# Patient Record
Sex: Female | Born: 1950 | Race: White | Hispanic: No | Marital: Married | Smoking: Never smoker
Health system: Southern US, Community
[De-identification: ages and names within clinical notes are randomized; demographics above are authoritative.]

## PROBLEM LIST (undated history)

## (undated) DIAGNOSIS — M8580 Other specified disorders of bone density and structure, unspecified site: Secondary | ICD-10-CM

## (undated) DIAGNOSIS — Z780 Asymptomatic menopausal state: Secondary | ICD-10-CM

## (undated) DIAGNOSIS — N946 Dysmenorrhea, unspecified: Secondary | ICD-10-CM

## (undated) DIAGNOSIS — G709 Myoneural disorder, unspecified: Secondary | ICD-10-CM

## (undated) DIAGNOSIS — E559 Vitamin D deficiency, unspecified: Secondary | ICD-10-CM

## (undated) DIAGNOSIS — M1990 Unspecified osteoarthritis, unspecified site: Secondary | ICD-10-CM

## (undated) DIAGNOSIS — I77810 Thoracic aortic ectasia: Secondary | ICD-10-CM

## (undated) DIAGNOSIS — I10 Essential (primary) hypertension: Secondary | ICD-10-CM

## (undated) HISTORY — DX: Unspecified osteoarthritis, unspecified site: M19.90

## (undated) HISTORY — DX: Vitamin D deficiency, unspecified: E55.9

## (undated) HISTORY — DX: Dysmenorrhea, unspecified: N94.6

## (undated) HISTORY — DX: Asymptomatic menopausal state: Z78.0

## (undated) HISTORY — PX: COLONOSCOPY: SHX174

## (undated) HISTORY — DX: Other specified disorders of bone density and structure, unspecified site: M85.80

---

## 1982-05-29 HISTORY — PX: AUGMENTATION MAMMAPLASTY: SUR837

## 1991-10-30 HISTORY — PX: CRYOTHERAPY: SHX1416

## 1997-10-29 DIAGNOSIS — Z780 Asymptomatic menopausal state: Secondary | ICD-10-CM

## 1997-10-29 HISTORY — DX: Asymptomatic menopausal state: Z78.0

## 1999-08-24 ENCOUNTER — Other Ambulatory Visit: Admission: RE | Admit: 1999-08-24 | Discharge: 1999-08-24 | Payer: Self-pay | Admitting: Obstetrics and Gynecology

## 1999-08-24 ENCOUNTER — Encounter (INDEPENDENT_AMBULATORY_CARE_PROVIDER_SITE_OTHER): Payer: Self-pay | Admitting: Specialist

## 1999-10-30 DIAGNOSIS — M8580 Other specified disorders of bone density and structure, unspecified site: Secondary | ICD-10-CM

## 1999-10-30 HISTORY — DX: Other specified disorders of bone density and structure, unspecified site: M85.80

## 2000-08-22 ENCOUNTER — Other Ambulatory Visit: Admission: RE | Admit: 2000-08-22 | Discharge: 2000-08-22 | Payer: Self-pay | Admitting: Obstetrics and Gynecology

## 2001-08-26 ENCOUNTER — Other Ambulatory Visit: Admission: RE | Admit: 2001-08-26 | Discharge: 2001-08-26 | Payer: Self-pay | Admitting: Obstetrics and Gynecology

## 2002-09-21 ENCOUNTER — Other Ambulatory Visit: Admission: RE | Admit: 2002-09-21 | Discharge: 2002-09-21 | Payer: Self-pay | Admitting: Obstetrics and Gynecology

## 2004-01-12 ENCOUNTER — Other Ambulatory Visit: Admission: RE | Admit: 2004-01-12 | Discharge: 2004-01-12 | Payer: Self-pay | Admitting: Obstetrics and Gynecology

## 2005-09-18 ENCOUNTER — Other Ambulatory Visit: Admission: RE | Admit: 2005-09-18 | Discharge: 2005-09-18 | Payer: Self-pay | Admitting: Obstetrics and Gynecology

## 2007-10-30 DIAGNOSIS — E559 Vitamin D deficiency, unspecified: Secondary | ICD-10-CM

## 2007-10-30 HISTORY — DX: Vitamin D deficiency, unspecified: E55.9

## 2008-01-23 ENCOUNTER — Other Ambulatory Visit: Admission: RE | Admit: 2008-01-23 | Discharge: 2008-01-23 | Payer: Self-pay | Admitting: Obstetrics and Gynecology

## 2009-01-24 ENCOUNTER — Other Ambulatory Visit: Admission: RE | Admit: 2009-01-24 | Discharge: 2009-01-24 | Payer: Self-pay | Admitting: Obstetrics and Gynecology

## 2013-03-03 ENCOUNTER — Encounter: Payer: Self-pay | Admitting: Certified Nurse Midwife

## 2013-03-24 ENCOUNTER — Ambulatory Visit: Payer: Self-pay | Admitting: Nurse Practitioner

## 2013-05-13 ENCOUNTER — Ambulatory Visit (INDEPENDENT_AMBULATORY_CARE_PROVIDER_SITE_OTHER): Payer: BC Managed Care – PPO | Admitting: Nurse Practitioner

## 2013-05-13 ENCOUNTER — Encounter: Payer: Self-pay | Admitting: Nurse Practitioner

## 2013-05-13 VITALS — BP 128/80 | HR 76 | Resp 16 | Ht 67.0 in | Wt 243.0 lb

## 2013-05-13 DIAGNOSIS — R3589 Other polyuria: Secondary | ICD-10-CM

## 2013-05-13 DIAGNOSIS — R358 Other polyuria: Secondary | ICD-10-CM

## 2013-05-13 DIAGNOSIS — Z01419 Encounter for gynecological examination (general) (routine) without abnormal findings: Secondary | ICD-10-CM

## 2013-05-13 DIAGNOSIS — Z0000 Encounter for general adult medical examination without abnormal findings: Secondary | ICD-10-CM

## 2013-05-13 DIAGNOSIS — Z23 Encounter for immunization: Secondary | ICD-10-CM

## 2013-05-13 LAB — COMPREHENSIVE METABOLIC PANEL
Albumin: 4.1 g/dL (ref 3.5–5.2)
Alkaline Phosphatase: 57 U/L (ref 39–117)
BUN: 17 mg/dL (ref 6–23)
CO2: 29 mEq/L (ref 19–32)
Calcium: 9.1 mg/dL (ref 8.4–10.5)
Glucose, Bld: 96 mg/dL (ref 70–99)
Potassium: 4.4 mEq/L (ref 3.5–5.3)
Total Protein: 6.9 g/dL (ref 6.0–8.3)

## 2013-05-13 LAB — POCT URINALYSIS DIPSTICK

## 2013-05-13 LAB — LIPID PANEL
Cholesterol: 199 mg/dL (ref 0–200)
HDL: 56 mg/dL (ref >39)
LDL Cholesterol: 125 mg/dL — ABNORMAL HIGH (ref 0–99)
Triglycerides: 91 mg/dL (ref <150)

## 2013-05-13 MED ORDER — ERGOCALCIFEROL 1.25 MG (50000 UT) PO CAPS: 50000.0000 [IU] | ORAL_CAPSULE | ORAL | Status: DC

## 2013-05-13 NOTE — Progress Notes (Signed)
62 y.o. G0 Married Caucasian Fe here for annual exam.  No new health problems. Continues to work full time. Husband with prostate cancer.  Then had liver biopsy and diagnosed with cancer and will be doing surgery or chemo in near future.  He has COPD. LMP: 10/29/1997           Sexually active: no  The current method of family planning is post menopausal status.    Exercising: no  not regularly Smoker:  no  Health Maintenance: Pap:  03/11/12- ASCUS - HR HPV MMG:  08/05/12 normal Colonoscopy:  No she is planning to get scheduled this year BMD:   2011 TDaP:  10/30/91 Labs: Hgb: 12.8    ;   Urine: Leuk's 2     No past medical history on file.  Past Surgical History  Procedure Laterality Date  . Augmentation mammaplasty    . Cryotherapy      Current Outpatient Prescriptions  Medication Sig Dispense Refill  . Acetaminophen (TYLENOL PO) Take by mouth as needed.      . Ascorbic Acid (VITAMIN C PO) Take by mouth daily.      . Calcium Carbonate-Vitamin D (CALCIUM + D PO) Take by mouth.      . Cholecalciferol (VITAMIN D PO) Take by mouth.      . fish oil-omega-3 fatty acids 1000 MG capsule Take 2 g by mouth daily.      . Horse Chestnut (VENASTAT PO) Take by mouth.      Marland Kitchen NAPROXEN PO Take by mouth.       No current facility-administered medications for this visit.    Family History  Problem Relation Age of Onset  . Hypertension Mother   . Osteoporosis Mother   . Hypertension Father   . Osteoporosis Maternal Aunt     ROS:  Pertinent items are noted in HPI.  Otherwise, a comprehensive ROS was negative.  Exam:   There were no vitals taken for this visit.    Ht Readings from Last 3 Encounters:  No data found for Ht    General appearance: alert, cooperative and appears stated age Head: Normocephalic, without obvious abnormality, atraumatic Neck: no adenopathy, supple, symmetrical, trachea midline and thyroid normal to inspection and palpation Lungs: clear to auscultation  bilaterally Breasts: normal appearance, no masses or tenderness Heart: regular rate and rhythm Abdomen: soft, non-tender; no masses,  no organomegaly Extremities: extremities normal, atraumatic, no cyanosis or edema Skin: Skin color, texture, turgor normal. No rashes or lesions Lymph nodes: Cervical, supraclavicular, and axillary nodes normal. No abnormal inguinal nodes palpated Neurologic: Grossly normal   Pelvic: External genitalia:  no lesions              Urethra:  normal appearing urethra with no masses, tenderness or lesions              Bartholin's and Skene's: normal                 Vagina: normal appearing vagina with normal color and discharge, no lesions              Cervix: anteverted              Pap taken: no Bimanual Exam:  Uterus:  normal size, contour, position, consistency, mobility, non-tender              Adnexa: no mass, fullness, tenderness               Rectovaginal: Confirms  Anus:  normal sphincter tone, no lesions  A:  Well Woman with normal exam  Postmenopausal   Not sexually active  Vit D deficiency  R/O UTI  Update TDaP today   P:   Pap smear as per guidelines   Mammogram due 10/14  Routine labs and will follow  TDaP given today  counseled on breast self exam, adequate intake of calcium and vitamin D,   diet and exercise return annually or prn  An After Visit Summary was printed and given to the patient.

## 2013-05-13 NOTE — Patient Instructions (Addendum)

## 2013-05-14 ENCOUNTER — Telehealth: Payer: Self-pay | Admitting: *Deleted

## 2013-05-14 LAB — URINE CULTURE
Colony Count: NO GROWTH
Organism ID, Bacteria: NO GROWTH

## 2013-05-14 NOTE — Telephone Encounter (Signed)
LVM for pt to return my call in regards to lab results.  

## 2013-05-14 NOTE — Telephone Encounter (Signed)
Message copied by Osie Bond on Thu May 14, 2013  1:41 PM ------      Message from: Ria Comment R      Created: Thu May 14, 2013  8:52 AM       Let patient know lab results. Continue on low cholesterol diet. Follow Vit D per protocol. ------

## 2013-05-14 NOTE — Progress Notes (Signed)
Encounter reviewed by Dr. Brook Silva.  

## 2013-05-15 NOTE — Telephone Encounter (Signed)
Patient returning Tiffany's call.

## 2013-05-15 NOTE — Telephone Encounter (Signed)
Pt notified of blood and urine results Pt would like to know she needs to schedule bone density this year.  Last DEXA was in 2011.  Family history of osteoporosis.  Please advise.  Pt aware she may not get answer until Monday.

## 2013-05-18 ENCOUNTER — Other Ambulatory Visit: Payer: Self-pay | Admitting: Nurse Practitioner

## 2013-05-18 DIAGNOSIS — M8580 Other specified disorders of bone density and structure, unspecified site: Secondary | ICD-10-CM

## 2013-05-18 NOTE — Telephone Encounter (Signed)
Patient does have osteopenia and should get repeat BMD.  Last one done 02/2010. Order has been sent

## 2013-05-19 ENCOUNTER — Telehealth: Payer: Self-pay | Admitting: *Deleted

## 2013-05-19 NOTE — Telephone Encounter (Signed)
2nd VM left for pt to return my call in regards to lab results.

## 2013-10-21 ENCOUNTER — Encounter: Payer: Self-pay | Admitting: Nurse Practitioner

## 2014-05-20 ENCOUNTER — Ambulatory Visit: Payer: BC Managed Care – PPO | Admitting: Nurse Practitioner

## 2014-05-26 ENCOUNTER — Telehealth: Payer: Self-pay | Admitting: *Deleted

## 2014-05-26 MED ORDER — ERGOCALCIFEROL 1.25 MG (50000 UT) PO CAPS: 50000.0000 [IU] | ORAL_CAPSULE | ORAL | Status: DC

## 2014-05-26 NOTE — Telephone Encounter (Signed)
Last refilled: 05/13/13 #30/3 refills Last AEX: 05/13/13 with Ms. Patty AEX Scheduled: 06/01/14 with Ms. Patty Last Vitamin D Level checked at AEX 50  Vitamin D 50,000 #30/0 refills sent to pharmacy to last patient until AEX   Routed to provider for review, encounter closed.

## 2014-06-01 ENCOUNTER — Encounter: Payer: Self-pay | Admitting: Nurse Practitioner

## 2014-06-01 ENCOUNTER — Ambulatory Visit (INDEPENDENT_AMBULATORY_CARE_PROVIDER_SITE_OTHER): Payer: BC Managed Care – PPO | Admitting: Nurse Practitioner

## 2014-06-01 VITALS — BP 126/80 | HR 68 | Ht 67.5 in | Wt 253.0 lb

## 2014-06-01 DIAGNOSIS — M949 Disorder of cartilage, unspecified: Secondary | ICD-10-CM

## 2014-06-01 DIAGNOSIS — Z1211 Encounter for screening for malignant neoplasm of colon: Secondary | ICD-10-CM

## 2014-06-01 DIAGNOSIS — R8290 Unspecified abnormal findings in urine: Secondary | ICD-10-CM

## 2014-06-01 DIAGNOSIS — Z0000 Encounter for general adult medical examination without abnormal findings: Secondary | ICD-10-CM

## 2014-06-01 DIAGNOSIS — M8580 Other specified disorders of bone density and structure, unspecified site: Secondary | ICD-10-CM

## 2014-06-01 DIAGNOSIS — R82998 Other abnormal findings in urine: Secondary | ICD-10-CM

## 2014-06-01 DIAGNOSIS — M899 Disorder of bone, unspecified: Secondary | ICD-10-CM

## 2014-06-01 DIAGNOSIS — Z01419 Encounter for gynecological examination (general) (routine) without abnormal findings: Secondary | ICD-10-CM

## 2014-06-01 LAB — LIPID PANEL
CHOL/HDL RATIO: 3.2 ratio
Cholesterol: 197 mg/dL (ref 0–200)
HDL: 62 mg/dL (ref >39)
LDL Cholesterol: 116 mg/dL — ABNORMAL HIGH (ref 0–99)
Triglycerides: 94 mg/dL (ref <150)
VLDL: 19 mg/dL (ref 0–40)

## 2014-06-01 LAB — COMPREHENSIVE METABOLIC PANEL
ALK PHOS: 57 U/L (ref 39–117)
ALT: 21 U/L (ref 0–35)
AST: 21 U/L (ref 0–37)
Albumin: 3.9 g/dL (ref 3.5–5.2)
BUN: 14 mg/dL (ref 6–23)
CALCIUM: 9.1 mg/dL (ref 8.4–10.5)
CO2: 28 mEq/L (ref 19–32)
Chloride: 104 mEq/L (ref 96–112)
Creat: 0.71 mg/dL (ref 0.50–1.10)
Glucose, Bld: 96 mg/dL (ref 70–99)
Potassium: 3.9 mEq/L (ref 3.5–5.3)
SODIUM: 140 meq/L (ref 135–145)
TOTAL PROTEIN: 7 g/dL (ref 6.0–8.3)
Total Bilirubin: 0.4 mg/dL (ref 0.2–1.2)

## 2014-06-01 LAB — HEMOGLOBIN, FINGERSTICK: Hemoglobin, fingerstick: 12.8 g/dL (ref 12.0–16.0)

## 2014-06-01 LAB — POCT URINALYSIS DIPSTICK
Bilirubin, UA: NEGATIVE
Glucose, UA: NEGATIVE
Ketones, UA: NEGATIVE
NITRITE UA: NEGATIVE
PROTEIN UA: NEGATIVE
RBC UA: NEGATIVE
UROBILINOGEN UA: NEGATIVE
pH, UA: 7

## 2014-06-01 LAB — HEMOGLOBIN A1C
Hgb A1c MFr Bld: 5.7 % — ABNORMAL HIGH (ref <5.7)
MEAN PLASMA GLUCOSE: 117 mg/dL — AB (ref <117)

## 2014-06-01 LAB — TSH: TSH: 2.174 u[IU]/mL (ref 0.350–4.500)

## 2014-06-01 MED ORDER — ERGOCALCIFEROL 1.25 MG (50000 UT) PO CAPS: 50000.0000 [IU] | ORAL_CAPSULE | ORAL | Status: DC

## 2014-06-01 NOTE — Progress Notes (Signed)
Patient ID: Toni Johnson, female   DOB: [birth year], 63 y.o.   MRN: [id number] 63 y.o. G0P0 Married Caucasian Fe here for annual exam.  No new diagnosis since last visit.  Her husband has prostate and liver cancer and is undergoing intense therapy and chemo.  Plans to continue working at least for several more years.  Patient's last menstrual period was 10/29/1997.          Sexually active: no  The current method of family planning is post menopausal status.  Exercising: no not regularly  Smoker: no   Health Maintenance:  Pap: 03/11/12- ASCUS - HR HPV  MMG: 08/06/13, Bi-Rads 1: negative  Colonoscopy: No  BMD: 2011  TDaP: 05/13/13 Labs:  HB:  12.8   Urine:  2+ leuk's - asymptomatic   reports that she has never smoked. She has never used smokeless tobacco. She reports that she does not drink alcohol or use illicit drugs.  Past Medical History  Diagnosis Date  . Dysmenorrhea   . Vitamin D deficiency disease 2009  . Postmenopausal state 1999    HRT X 2 years  . Osteopenia 2001  . Arthritis     Past Surgical History  Procedure Laterality Date  . 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 Augmentation mammaplasty Bilateral 05/1982    Current Outpatient Prescriptions  Medication Sig Dispense Refill  . Acetaminophen (TYLENOL PO) Take by mouth as needed.      . Calcium Carbonate-Vitamin D (CALCIUM + D PO) Take by mouth.      Marland Kitchen. CINNAMON PO Take by mouth daily.      . ergocalciferol (VITAMIN D2) 50000 UNITS capsule Take 1 capsule (50,000 Units total) by mouth once a week.  30 capsule  3  . fish oil-omega-3 fatty acids 1000 MG capsule Take 2 g by mouth daily.      . Multiple Vitamins-Minerals (MULTIVITAMIN PO) Take by mouth.      Marland Kitchen. NAPROXEN PO Take 220 mg by mouth once.       . NON FORMULARY [employer] InstaFlex, once daily for joints       No current facility-administered medications for this visit.    Family History  Problem Relation Age of Onset  . Hypertension Mother   . Osteoporosis Mother   .  Osteoarthritis Mother   . Hypertension Father   . Diabetes Father   . Heart failure Father   . Osteoporosis Maternal Aunt   . Diabetes Sister   . Colitis Sister   . Osteoarthritis Sister   . Cancer Brother     ROS:  Pertinent items are noted in HPI.  Otherwise, a comprehensive ROS was negative.  Exam:   BP 126/80  Pulse 68  Ht 5' 7.5" (1.715 m)  Wt 253 lb (114.76 kg)  BMI 39.02 kg/m2  LMP 10/29/1997 Height: 5' 7.5" (171.5 cm)  Ht Readings from Last 3 Encounters:  06/01/14 5' 7.5" (1.715 m)  05/13/13 5\' 7"  (1.702 m)    General appearance: alert, cooperative and appears stated age Head: Normocephalic, without obvious abnormality, atraumatic Neck: no adenopathy, supple, symmetrical, trachea midline and thyroid normal to inspection and palpation Lungs: clear to auscultation bilaterally Breasts: normal appearance, no masses or tenderness Heart: regular rate and rhythm Abdomen: soft, non-tender; no masses,  no organomegaly Extremities: extremities normal, atraumatic, no cyanosis or edema Skin: Skin color, texture, turgor normal. No rashes or lesions Lymph nodes: Cervical, supraclavicular, and axillary nodes normal. No abnormal inguinal nodes palpated Neurologic: Grossly  normal   Pelvic: External genitalia:  no lesions              Urethra:  normal appearing urethra with no masses, tenderness or lesions              Bartholin's and Skene's: normal                 Vagina: very atrophic appearing vagina with pale color and discharge, no lesions              Cervix: anteverted              Pap taken: Yes.   Bimanual Exam:  Uterus:  normal size, contour, position, consistency, mobility, non-tender              Adnexa: no mass, fullness, tenderness               Rectovaginal: Confirms               Anus:  normal sphincter tone, no lesions  A:  Well Woman with normal exam  Postmenopausal with HRT from 199- 02/2001  Situational depression  Remote history of CIN I with cryo 1993;  recent history of ASCUS negative HR HPV 2013  R/O UTI  P:   Reviewed health and wellness pertinent to exam  Pap smear taken today  Mammogram is due now and will schedule; will also get BMD  Will follow with labs and urine  Advise to establish with PCP; and GI consult with Dr. Loreta Ave  IFOB given today  Counseled on breast self exam, mammography screening, adequate intake of calcium and vitamin D, diet and exercise, Kegel's exercises return annually or prn  An After Visit Summary was printed and given to the patient.

## 2014-06-01 NOTE — Patient Instructions (Signed)

## 2014-06-02 LAB — VITAMIN D 25 HYDROXY (VIT D DEFICIENCY, FRACTURES): Vit D, 25-Hydroxy: 53 ng/mL (ref 30–89)

## 2014-06-02 LAB — URINE CULTURE
Colony Count: NO GROWTH
ORGANISM ID, BACTERIA: NO GROWTH

## 2014-06-03 ENCOUNTER — Telehealth: Payer: Self-pay | Admitting: *Deleted

## 2014-06-03 LAB — IPS PAP TEST WITH HPV

## 2014-06-03 NOTE — Telephone Encounter (Signed)
I have attempted to contact this patient by phone with the following results: left message to return my call on answering machine ([facility][phone]

## 2014-06-03 NOTE — Telephone Encounter (Signed)
Message copied by Luisa DagoPHILLIPS, Malique Driskill C on Thu Jun 03, 2014  9:26 AM ------      Message from: Ria CommentGRUBB, PATRICIA R      Created: Tue Jun 01, 2014 11:36 PM       Let patient know that lipid panel shows an upper level of total cholesterol and elevated LDL.  She needs to be on a low cholesterol diet.  CMP, TSH is normal.  The HGB AIC is also elevated to 5.7 showing an increased risk of diabetes.  The other test for Vit D and urine culture are pending. ------

## 2014-06-04 ENCOUNTER — Other Ambulatory Visit: Payer: Self-pay

## 2014-06-04 NOTE — Telephone Encounter (Signed)
Last AEX: 06/01/14 Last refill:06/01/14 #30, 3 refs Current AEX:06/13/15  Pt was given new rx on 06/01/14 during AEX. It was sent to [facility] per office visit 06/01/14  Encounter closed

## 2014-06-04 NOTE — Telephone Encounter (Signed)
Pt notified in result note.  

## 2014-06-06 NOTE — Progress Notes (Signed)
Encounter reviewed by Dr. Kimbria Camposano Silva.  

## 2014-06-07 NOTE — Addendum Note (Signed)
Addended by: Roanna BanningGRUBB, Karl Erway R on: 06/07/2014 12:35 PM   Modules accepted: Orders

## 2014-06-08 ENCOUNTER — Ambulatory Visit: Payer: BC Managed Care – PPO | Admitting: Nurse Practitioner

## 2014-06-11 ENCOUNTER — Telehealth: Payer: Self-pay | Admitting: Nurse Practitioner

## 2014-06-11 NOTE — Telephone Encounter (Signed)
Left message for patient to call back. Need to advised of appt with Dr Loreta AveMann 08.20.2015 @ 1400.

## 2014-06-14 NOTE — Telephone Encounter (Signed)
Spoke with patient. Advised of appointment with Dr Loreta AveMann.

## 2014-07-21 ENCOUNTER — Telehealth: Payer: Self-pay | Admitting: Nurse Practitioner

## 2014-07-21 NOTE — Telephone Encounter (Signed)
Order for BMD to Lauro Franklin, FNP desk for review and signature before fax to [facility].

## 2014-07-21 NOTE — Telephone Encounter (Signed)
Pt calling for bone density order to be faxed.

## 2014-07-23 NOTE — Telephone Encounter (Signed)
Order for BMD faxed to [facility] with cover sheet. Left message for patient to call Kaitlyn at [phone]. Need to advise order has been sent.

## 2014-09-06 ENCOUNTER — Telehealth: Payer: Self-pay | Admitting: Nurse Practitioner

## 2014-09-06 NOTE — Telephone Encounter (Signed)
Please let patient know that BMD done on 08/23/14 shows a T score of the lumbar spine at 0.0 and the left femoral neck at -1.8; right femoral neck at -1.7.  This puts her in the osteopenic range for the hips and normal range for the spine.  Comparison to 2011 there has been no significant loss at spine or both hips.  Good news!  Needs to continue with calcium, Vit D and walking.  The FRAX score for 10 year probability for major fracture is 16.6 % (goal is < 20%); and for hip fracture at 1.1% (goal is < 3%).  It is OK to repeat in 3-4 years.

## 2014-09-14 NOTE — Telephone Encounter (Signed)
Pt informed of results. Pt voiced understanding. Encounter closed

## 2015-06-13 ENCOUNTER — Ambulatory Visit (INDEPENDENT_AMBULATORY_CARE_PROVIDER_SITE_OTHER): Payer: BLUE CROSS/BLUE SHIELD | Admitting: Nurse Practitioner

## 2015-06-13 ENCOUNTER — Encounter: Payer: Self-pay | Admitting: Nurse Practitioner

## 2015-06-13 VITALS — BP 126/84 | HR 80 | Ht 67.25 in | Wt 253.0 lb

## 2015-06-13 DIAGNOSIS — M8580 Other specified disorders of bone density and structure, unspecified site: Secondary | ICD-10-CM | POA: Diagnosis not present

## 2015-06-13 DIAGNOSIS — Z0000 Encounter for general adult medical examination without abnormal findings: Secondary | ICD-10-CM

## 2015-06-13 DIAGNOSIS — Z01419 Encounter for gynecological examination (general) (routine) without abnormal findings: Secondary | ICD-10-CM | POA: Diagnosis not present

## 2015-06-13 LAB — COMPREHENSIVE METABOLIC PANEL
ALBUMIN: 3.8 g/dL (ref 3.6–5.1)
ALT: 19 U/L (ref 6–29)
AST: 19 U/L (ref 10–35)
Alkaline Phosphatase: 48 U/L (ref 33–130)
BUN: 17 mg/dL (ref 7–25)
CALCIUM: 9.2 mg/dL (ref 8.6–10.4)
CHLORIDE: 103 mmol/L (ref 98–110)
CO2: 27 mmol/L (ref 20–31)
CREATININE: 0.78 mg/dL (ref 0.50–0.99)
Glucose, Bld: 102 mg/dL — ABNORMAL HIGH (ref 65–99)
POTASSIUM: 4.3 mmol/L (ref 3.5–5.3)
SODIUM: 139 mmol/L (ref 135–146)
TOTAL PROTEIN: 7 g/dL (ref 6.1–8.1)
Total Bilirubin: 0.5 mg/dL (ref 0.2–1.2)

## 2015-06-13 LAB — POCT URINALYSIS DIPSTICK
BILIRUBIN UA: NEGATIVE
GLUCOSE UA: NEGATIVE
Ketones, UA: NEGATIVE
NITRITE UA: NEGATIVE
Protein, UA: NEGATIVE
RBC UA: NEGATIVE
UROBILINOGEN UA: NEGATIVE
pH, UA: 6.5

## 2015-06-13 LAB — LIPID PANEL
CHOL/HDL RATIO: 3.2 ratio (ref <=5.0)
CHOLESTEROL: 188 mg/dL (ref 125–200)
HDL: 58 mg/dL (ref >=46)
LDL CALC: 111 mg/dL (ref <130)
TRIGLYCERIDES: 93 mg/dL (ref <150)
VLDL: 19 mg/dL (ref <30)

## 2015-06-13 LAB — HEMOGLOBIN, FINGERSTICK: Hemoglobin, fingerstick: 12.1 g/dL (ref 12.0–16.0)

## 2015-06-13 LAB — TSH: TSH: 2.441 u[IU]/mL (ref 0.350–4.500)

## 2015-06-13 NOTE — Patient Instructions (Addendum)

## 2015-06-13 NOTE — Progress Notes (Signed)
Patient ID: Toni Johnson, female   DOB: [birth year], 64 y.o.   MRN: [id number] 64 y.o. G0P0 Married  Caucasian Fe here for annual exam. 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  Husband is stable on chemo treatment for liver and prostate cancer.  She continues to work.  Patient's last menstrual period was 10/29/1997 (approximate).          Sexually active: No.  The current method of family planning is none.    Exercising: No.  The patient does not participate in regular exercise at present. Smoker:  no  Health Maintenance: Pap:  06/01/14, Negative with neg HR HPV MMG: 08/23/14, 3D, Bi-Rads 2:  benign findings Colonoscopy:  2015, normal, repeat in 10 years (Dr. Loreta Ave) BMD:  08/23/14, T Score 0.0 S/-1.8 L/-1.7 R TDaP: 05/13/13 Shingles:  Not yet Labs: HB:  12.1  Urine:  2+ leuk's - no symptoms - vaginal contamination   reports that she has never smoked. She has never used smokeless tobacco. She reports that she does not drink alcohol or use illicit drugs.  Past Medical History  Diagnosis Date  . Dysmenorrhea   . Vitamin D deficiency disease 2009  . Postmenopausal state 1999    HRT X 2 years  . Osteopenia 2001  . Arthritis     Past Surgical History  Procedure Laterality Date  . 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 Augmentation mammaplasty Bilateral 05/1982    Current Outpatient Prescriptions  Medication Sig Dispense Refill  . Acetaminophen (TYLENOL PO) Take by mouth as needed.    . Calcium Carbonate-Vitamin D (CALCIUM + D PO) Take by mouth.    . cholecalciferol (VITAMIN D) 1000 UNITS tablet Take 1,000 Units by mouth daily.    Marland Kitchen CINNAMON PO Take by mouth daily.    . fish oil-omega-3 fatty acids 1000 MG capsule Take 2 g by mouth daily.    . Multiple Vitamins-Minerals (MULTIVITAMIN PO) Take by mouth.    Marland Kitchen NAPROXEN PO Take 220 mg by mouth once.     . NON FORMULARY [employer] InstaFlex, once daily for joints     No current facility-administered medications for this visit.    Family  History  Problem Relation Age of Onset  . Hypertension Mother   . Osteoporosis Mother   . Osteoarthritis Mother   . Hypertension Father   . Diabetes Father   . Heart failure Father   . Osteoporosis Maternal Aunt   . Diabetes Sister   . Colitis Sister   . Osteoarthritis Sister   . Cancer Brother     ROS:  Pertinent items are noted in HPI.  Otherwise, a comprehensive ROS was negative.  Exam:   BP 126/84 mmHg  Pulse 80  Ht 5' 7.25" (1.708 m)  Wt 253 lb (114.76 kg)  BMI 39.34 kg/m2  LMP 10/29/1997 (Approximate) Height: 5' 7.25" (170.8 cm) Ht Readings from Last 3 Encounters:  06/13/15 5' 7.25" (1.708 m)  06/01/14 5' 7.5" (1.715 m)  05/13/13 5\' 7"  (1.702 m)    General appearance: alert, cooperative and appears stated age Head: Normocephalic, without obvious abnormality, atraumatic Neck: no adenopathy, supple, symmetrical, trachea midline and thyroid normal to inspection and palpation Lungs: clear to auscultation bilaterally Breasts: normal appearance, no masses or tenderness Heart: regular rate and rhythm Abdomen: soft, non-tender; no masses,  no organomegaly Extremities: extremities normal, atraumatic, no cyanosis or edema Skin: Skin color, texture, turgor normal. No rashes or lesions Lymph nodes: Cervical, supraclavicular, and axillary  nodes normal. No abnormal inguinal nodes palpated Neurologic: Grossly normal   Pelvic: External genitalia:  no lesions              Urethra:  normal appearing urethra with no masses, tenderness or lesions              Bartholin's and Skene's: normal                 Vagina: normal appearing vagina with normal color and discharge, no lesions              Cervix: anteverted              Pap taken: No. Bimanual Exam:  Uterus:  normal size, contour, position, consistency, mobility, non-tender              Adnexa: no mass, fullness, tenderness               Rectovaginal: Confirms               Anus:  normal sphincter tone, no  lesions  Chaperone present:  no  A:  Well Woman with normal exam  Postmenopausal with HRT from 199- 02/2001 Situational depression Remote history of CIN I with cryo 1993; recent history of ASCUS negative HR HPV 2013   P:   Reviewed health and wellness pertinent to exam  Pap smear as above  Mammogram is due 07/2015  Will follow with labs  Advised that she needs to establish care with PCP  Counseled on breast self exam, mammography screening, adequate intake of calcium and vitamin D, diet and exercise return annually or prn  An After Visit Summary was printed and given to the patient.

## 2015-06-14 LAB — VITAMIN D 25 HYDROXY (VIT D DEFICIENCY, FRACTURES): Vit D, 25-Hydroxy: 38 ng/mL (ref 30–100)

## 2015-06-14 NOTE — Progress Notes (Signed)
Encounter reviewed by Dr. Brook Amundson C. Silva.  

## 2015-06-15 ENCOUNTER — Telehealth: Payer: Self-pay | Admitting: *Deleted

## 2015-06-15 NOTE — Telephone Encounter (Signed)
-----   Message from Ria Comment, FNP sent at 06/14/2015  8:08 AM EDT ----- Please let pt. Know that Vit D is lower than last year 53 -38. Have her to take VIt D OTC 2000 IU during the fall - spring months as she will most likely fall lower then. The lipid panel was normal, CMP was normal except slight increase in glucose. TSH is normal.

## 2015-06-15 NOTE — Telephone Encounter (Signed)
I have attempted to contact this patient by phone with the following results: left message to return call to Stephanie at [phone] answering machine (home per DPR). No personal information given. [phone] (Home) *Preferred* 

## 2015-06-16 NOTE — Telephone Encounter (Signed)
Pt notified in result note.  Closing encounter. 

## 2015-06-22 ENCOUNTER — Telehealth: Payer: Self-pay | Admitting: *Deleted

## 2015-06-22 NOTE — Telephone Encounter (Signed)
-----   Message from Ria Comment, FNP sent at 06/17/2015  3:20 PM EDT ----- Joen Laura go back on RX Vit D weekly and recheck in 6 months.

## 2015-06-22 NOTE — Telephone Encounter (Signed)
I have attempted to contact this patient by phone with the following results: left message to return call to Stephanie at [phone] answering machine (home per DPR). No personal information given. [phone] (Home) *Preferred* 

## 2015-06-30 NOTE — Telephone Encounter (Signed)
Patient returned call. She is given message from [place].  Patient states she takes Vitamin D3 1000 international units bid and then her calcium has additional 600 international units that she has been taking daily.   I advised patient that I would like to clarify order with provider and return her call. Patient agreeable.   Toni Johnson,  Can you place order for Vitamin D prescription? Do you want her to take Vitamin D 50,000 international units weekly for 6 months and stop her daily vitamin D3?

## 2015-07-01 NOTE — Telephone Encounter (Signed)
So when her Vit D went from 53 - 38.   So lets  Do RX Vit D 50,000 IU weekly and recheck in 6 months. She may stop daily OTC Vit D.

## 2015-07-01 NOTE — Telephone Encounter (Signed)
I have attempted to contact this patient by phone with the following results: left message to return call to Stephanie at [phone] answering machine (home per DPR). No personal information given. [phone] (Home) *Preferred* 

## 2015-08-24 NOTE — Telephone Encounter (Signed)
I have attempted to contact this patient by phone with the following results: left message to return call to Toni Johnson at [phone] answering machine ([facility] No personal information given. [phone] (Home) *Preferred*

## 2015-08-30 MED ORDER — VITAMIN D (ERGOCALCIFEROL) 1.25 MG (50000 UNIT) PO CAPS: 50000.0000 [IU] | ORAL_CAPSULE | ORAL | Status: DC

## 2015-08-30 NOTE — Telephone Encounter (Signed)
Spoke with patient. Advised of message as seen below from Ria CommentPatricia Grubb, FNP. Patient is agreeable and verbalizes understanding. Rx for Vitamin D 50,000 IU weekly [facility]. Patient is agreeable. 6 month lab recheck scheduled for 02/28/2016 at 8:30 am.   Cc: Francee PiccoloStephanie Phillips, CMA  Routing to provider for final review. Patient agreeable to disposition. Will close encounter.

## 2016-02-28 ENCOUNTER — Other Ambulatory Visit (INDEPENDENT_AMBULATORY_CARE_PROVIDER_SITE_OTHER): Payer: BLUE CROSS/BLUE SHIELD

## 2016-02-28 DIAGNOSIS — R7989 Other specified abnormal findings of blood chemistry: Secondary | ICD-10-CM

## 2016-02-29 LAB — VITAMIN D 25 HYDROXY (VIT D DEFICIENCY, FRACTURES): VIT D 25 HYDROXY: 46 ng/mL (ref 30–100)

## 2016-03-08 ENCOUNTER — Telehealth: Payer: Self-pay | Admitting: *Deleted

## 2016-03-08 NOTE — Telephone Encounter (Signed)
Patient left message checking on vitamin d results  Notes Recorded by Ria CommentPatricia Grubb, FNP on 02/29/2016 at 9:17 AM Please let pt know that Vit D did improve on RX Vit D from 38 - 46. Have her to take RX Vit D at every other week and will recheck at AEX. May need a refill on med's.  Left Message To Call Back

## 2016-03-10 ENCOUNTER — Other Ambulatory Visit: Payer: Self-pay | Admitting: Nurse Practitioner

## 2016-03-12 ENCOUNTER — Telehealth: Payer: Self-pay | Admitting: *Deleted

## 2016-03-12 NOTE — Telephone Encounter (Signed)
-----   Message from Ria CommentPatricia Grubb, FNP sent at 02/29/2016  9:17 AM EDT ----- Please let pt know that Vit D did improve on RX Vit D from 38 - 46.  Have her to take RX Vit D at every other week and will recheck at AEX.  May need a refill on med's.

## 2016-03-12 NOTE — Telephone Encounter (Signed)
Medication refill request: Vitamin D  Last AEX:  06-13-15 Next AEX: 06-15-16 Last MMG (if hormonal medication request): 10-04-15 WNL  Refill authorized: please advise

## 2016-03-12 NOTE — Telephone Encounter (Signed)
I have attempted to contact this patient by phone with the following results: left message to return call to TorboyStephanie at [phone] answering machine (home per [facility]).  [phone] (Home) *Preferred*

## 2016-03-16 NOTE — Telephone Encounter (Signed)
Call to patient, left message to call back to VanderbiltSally or [place]opersvilleStephanie. Per DPR, can leave detailed message. Left message calling with test results, nothing wrong and no emergency.

## 2016-03-22 NOTE — Progress Notes (Signed)
Patient returned call from Toni Johnson regarding result notes. Gave patient below results. Patient states that pharmacy automatically refilled Vit D prescription. Closing encounter

## 2016-03-22 NOTE — Telephone Encounter (Signed)
I have attempted to contact this patient by phone with the following results: left message to return call to SenatobiaStephanie at [phone] on answering machine.  Not OK to leave personal information on this number, only requested return call.  [phone] (Mobile)

## 2016-03-22 NOTE — Telephone Encounter (Signed)
Returning a call to Stephanie. °

## 2016-03-23 NOTE — Telephone Encounter (Signed)
Attempted to return call to patient.  Message left for patient to return call at earliest convenience.  Advised if I am unavailable to ask to speak with Kennon RoundsSally or a triage nurse.

## 2016-03-23 NOTE — Telephone Encounter (Signed)
Second call to patient. Message left advising patient to disregard previous message as I did not see that she spoke with someone on 03/22/16 to receive lab results.  Apologized for calling twice when not needed.  Closing encounter.

## 2016-05-09 NOTE — Telephone Encounter (Signed)
Results given on 03/12/16

## 2016-06-07 ENCOUNTER — Other Ambulatory Visit: Payer: Self-pay | Admitting: Nurse Practitioner

## 2016-06-07 NOTE — Telephone Encounter (Signed)
Medication refill request: Vitamin D [id number] units Last AEX:  06/13/15 PG Next AEX: 06/15/16  Last MMG (if hormonal medication request): 10/04/15 [id number] negative Refill authorized: 03/12/16 #12 w/0 refills; today please advise, patient has appt coming up

## 2016-06-15 ENCOUNTER — Ambulatory Visit (INDEPENDENT_AMBULATORY_CARE_PROVIDER_SITE_OTHER): Payer: BLUE CROSS/BLUE SHIELD | Admitting: Nurse Practitioner

## 2016-06-15 ENCOUNTER — Encounter: Payer: Self-pay | Admitting: Nurse Practitioner

## 2016-06-15 VITALS — BP 126/78 | HR 60 | Ht 67.0 in | Wt 250.0 lb

## 2016-06-15 DIAGNOSIS — M8580 Other specified disorders of bone density and structure, unspecified site: Secondary | ICD-10-CM

## 2016-06-15 DIAGNOSIS — Z0000 Encounter for general adult medical examination without abnormal findings: Secondary | ICD-10-CM

## 2016-06-15 DIAGNOSIS — Z01419 Encounter for gynecological examination (general) (routine) without abnormal findings: Secondary | ICD-10-CM | POA: Diagnosis not present

## 2016-06-15 DIAGNOSIS — E559 Vitamin D deficiency, unspecified: Secondary | ICD-10-CM

## 2016-06-15 LAB — LIPID PANEL
CHOLESTEROL: 210 mg/dL — AB (ref 125–200)
HDL: 74 mg/dL (ref >=46)
LDL Cholesterol: 122 mg/dL (ref <130)
Total CHOL/HDL Ratio: 2.8 Ratio (ref <=5.0)
Triglycerides: 71 mg/dL (ref <150)
VLDL: 14 mg/dL (ref <30)

## 2016-06-15 LAB — COMPREHENSIVE METABOLIC PANEL
ALT: 16 U/L (ref 6–29)
AST: 17 U/L (ref 10–35)
Albumin: 4.1 g/dL (ref 3.6–5.1)
Alkaline Phosphatase: 48 U/L (ref 33–130)
BILIRUBIN TOTAL: 0.5 mg/dL (ref 0.2–1.2)
BUN: 14 mg/dL (ref 7–25)
CALCIUM: 9.6 mg/dL (ref 8.6–10.4)
CHLORIDE: 104 mmol/L (ref 98–110)
CO2: 27 mmol/L (ref 20–31)
Creat: 0.75 mg/dL (ref 0.50–0.99)
GLUCOSE: 100 mg/dL — AB (ref 65–99)
Potassium: 4.6 mmol/L (ref 3.5–5.3)
Sodium: 140 mmol/L (ref 135–146)
Total Protein: 7.4 g/dL (ref 6.1–8.1)

## 2016-06-15 LAB — CBC WITH DIFFERENTIAL/PLATELET
BASOS PCT: 0 %
Basophils Absolute: 0 cells/uL (ref 0–200)
Eosinophils Absolute: 94 cells/uL (ref 15–500)
Eosinophils Relative: 2 %
HEMATOCRIT: 37.9 % (ref 35.0–45.0)
HEMOGLOBIN: 12.7 g/dL (ref 11.7–15.5)
LYMPHS ABS: 1739 {cells}/uL (ref 850–3900)
Lymphocytes Relative: 37 %
MCH: 31.6 pg (ref 27.0–33.0)
MCHC: 33.5 g/dL (ref 32.0–36.0)
MCV: 94.3 fL (ref 80.0–100.0)
MONO ABS: 423 {cells}/uL (ref 200–950)
MPV: 9.3 fL (ref 7.5–12.5)
Monocytes Relative: 9 %
Neutro Abs: 2444 cells/uL (ref 1500–7800)
Neutrophils Relative %: 52 %
Platelets: 306 10*3/uL (ref 140–400)
RBC: 4.02 MIL/uL (ref 3.80–5.10)
RDW: 13.5 % (ref 11.0–15.0)
WBC: 4.7 10*3/uL (ref 3.8–10.8)

## 2016-06-15 LAB — VITAMIN D 25 HYDROXY (VIT D DEFICIENCY, FRACTURES): Vit D, 25-Hydroxy: 42 ng/mL (ref 30–100)

## 2016-06-15 LAB — TSH: TSH: 2.16 m[IU]/L

## 2016-06-15 LAB — HEMOGLOBIN, FINGERSTICK: HEMOGLOBIN, FINGERSTICK: 12.7 g/dL (ref 12.0–16.0)

## 2016-06-15 LAB — HEMOGLOBIN A1C
Hgb A1c MFr Bld: 5.6 % (ref <5.7)
MEAN PLASMA GLUCOSE: 114 mg/dL

## 2016-06-15 LAB — HEPATITIS C ANTIBODY: HCV Ab: NEGATIVE

## 2016-06-15 NOTE — Patient Instructions (Addendum)

## 2016-06-15 NOTE — Progress Notes (Signed)
Patient ID: Toni RoughenRachel Johnson, female   DOB: 28-Nov-1950, 65 y.o.   MRN: [id number]  65 y.o. G0P0000 Married  Caucasian Fe here for annual exam.  May need knee replacement  R> L from OA.  Currently taking Mobic daily.  She continues to work full time. husband had radiation to his chest to treat lymph nodes in his chest.    Patient's last menstrual period was 10/29/1997 (approximate).          Sexually active: No.  The current method of family planning is none.    Exercising: No.  The patient does not participate in regular exercise at present. Smoker:  no  Health Maintenance: Pap:  06/01/14, Negative with neg HR HPV MMG: 10/04/15, 3D, Bi-Rads 1: Negative Colonoscopy:  2015, normal, repeat in 10 years (Dr. Loreta AveMann) BMD:  08/23/14, T Score 0.0 Spine / -1.8 L eft Femur Neck / -1.7 Right Femur Neck TDaP: 05/13/13 Shingles: Never Pneumonia: Never Hep C: drawn today HIV: Not indicated due to age Labs: HB: 12.7, fasting labs drawn today   reports that she has never smoked. She has never used smokeless tobacco. She reports that she does not drink alcohol or use drugs.  Past Medical History:  Diagnosis Date  . Arthritis   . Dysmenorrhea   . Osteopenia 2001  . Postmenopausal state 1999   HRT X 2 years  . Vitamin D deficiency disease 2009    Past Surgical History:  Procedure Laterality Date  . AUGMENTATION MAMMAPLASTY Bilateral 05/1982  . CRYOTHERAPY  1993   CIN I    Current Outpatient Prescriptions  Medication Sig Dispense Refill  . Acetaminophen (TYLENOL PO) Take by mouth as needed.    . Calcium Carbonate-Vitamin D (CALCIUM + D PO) Take by mouth.    Marland Kitchen. CINNAMON PO Take by mouth daily.    . fish oil-omega-3 fatty acids 1000 MG capsule Take 2 g by mouth daily.    . meloxicam (MOBIC) 15 MG tablet Take 15 mg by mouth daily. with food  2  . Multiple Vitamins-Minerals (MULTIVITAMIN PO) Take by mouth.    . NON FORMULARY GNC InstaFlex, once daily for joints    . Turmeric 500 MG TABS Take 2 tablets  by mouth daily.    . Vitamin D, Ergocalciferol, (DRISDOL) 50000 units CAPS capsule TAKE 1 CAPSULE (50,000 UNITS TOTAL) BY MOUTH EVERY 7 (SEVEN) DAYS. 12 capsule 0   No current facility-administered medications for this visit.     Family History  Problem Relation Age of Onset  . Hypertension Mother   . Osteoporosis Mother   . Osteoarthritis Mother   . Hypertension Father   . Diabetes Father   . Heart failure Father   . Diabetes Sister   . Colitis Sister   . Osteoarthritis Sister   . Cancer Brother   . Osteoporosis Maternal Aunt     ROS:  Pertinent items are noted in HPI.  Otherwise, a comprehensive ROS was negative.  Exam:   BP 126/78 (BP Location: Right Arm, Patient Position: Sitting, Cuff Size: Large)   Pulse 60   Ht 5\' 7"  (1.702 m)   Wt 250 lb (113.4 kg)   LMP 10/29/1997 (Approximate)   BMI 39.16 kg/m  Height: 5\' 7"  (170.2 cm) Ht Readings from Last 3 Encounters:  06/15/16 5\' 7"  (1.702 m)  06/13/15 5' 7.25" (1.708 m)  06/01/14 5' 7.5" (1.715 m)    General appearance: alert, cooperative and appears stated age Head: Normocephalic, without obvious abnormality, atraumatic Neck: no  adenopathy, supple, symmetrical, trachea midline and thyroid normal to inspection and palpation Lungs: clear to auscultation bilaterally Breasts: normal appearance, no masses or tenderness Heart: regular rate and rhythm Abdomen: soft, non-tender; no masses,  no organomegaly Extremities: extremities normal, atraumatic, no cyanosis or edema decreased ROM with knee braces on bilaterally. Skin: Skin color, texture, turgor normal. No rashes or lesions Lymph nodes: Cervical, supraclavicular, and axillary nodes normal. No abnormal inguinal nodes palpated Neurologic: Grossly normal   Pelvic: External genitalia:  no lesions              Urethra:  normal appearing urethra with no masses, tenderness or lesions              Bartholin's and Skene's: normal                 Vagina: normal appearing  vagina with normal color and discharge, no lesions              Cervix: anteverted              Pap taken: No. Bimanual Exam:  Uterus:  normal size, contour, position, consistency, mobility, non-tender              Adnexa: no mass, fullness, tenderness               Rectovaginal: Confirms               Anus:  normal sphincter tone, no lesions  Chaperone present: no  A:  Well Woman with normal exam  Postmenopausal with HRT from 199- 02/2001 Situational depression Remote history of CIN I with cryo 1993; recent history of ASCUS negative HR HPV 2013, normal since  Situational stressors with husbands prostate, liver cancer  Osteopenia with history of OA bilateral knees.   P:   Reviewed health and wellness pertinent to exam  Pap smear as above  Mammogram is due 09/2016 and [facility]  Will follow with labs  She will try and get Shingles and Prevnar at pharmacy  Counseled on breast self exam, mammography screening, adequate intake of calcium and vitamin D, diet and exercise, Kegel's exercises return annually or prn  An After Visit Summary was printed and given to the patient.

## 2016-06-15 NOTE — Progress Notes (Signed)
Reviewed personally.  M. Suzanne Antonina Deziel, MD.  

## 2016-06-18 ENCOUNTER — Telehealth: Payer: Self-pay | Admitting: *Deleted

## 2016-06-18 NOTE — Telephone Encounter (Signed)
-----   Message from Ria CommentPatricia Grubb, FNP sent at 06/16/2016  8:49 AM EDT ----- Please let pt know that Hep C was negative as expected.  The Vit D was OK at 42 compared to last year at 6546.  The TSH, CBC was normal.   The CMP was normal except for slight elevated glucose of 100 - HGB AIC was normal at 5.6.  The lipid panel was normal except for slight elevated total cholesterol at 210.  Continue to work on low cholesterol diet and exercise.

## 2016-06-18 NOTE — Telephone Encounter (Signed)
I have attempted to contact this patient by phone with the following results: left message to return call to [place] at [phone] answering machine (home [facility]).  Advised call was regarding recent labs and to return call at her convenience.  [phone] (Home) *Preferred*

## 2016-06-19 ENCOUNTER — Encounter: Payer: Self-pay | Admitting: *Deleted

## 2016-06-21 NOTE — Telephone Encounter (Signed)
Pt notified in result note.  Closing encounter. 

## 2016-09-13 ENCOUNTER — Other Ambulatory Visit: Payer: Self-pay | Admitting: Nurse Practitioner

## 2016-09-13 NOTE — Telephone Encounter (Signed)
Medication refill request: Vitamin D [id number] units Last AEX:  06/15/16 PG Next AEX: 06/21/17 Last MMG (if hormonal medication request): 10/04/15 3D, BIRADS 1: Negative Refill authorized: 06/07/16 #12 w/0 refills; today please advise, last Vit D 06/15/16 = 42

## 2016-10-11 ENCOUNTER — Encounter: Payer: Self-pay | Admitting: Nurse Practitioner

## 2016-10-17 ENCOUNTER — Telehealth: Payer: Self-pay | Admitting: Nurse Practitioner

## 2016-10-17 NOTE — Telephone Encounter (Addendum)
Please let pt know that BMD results from 10/08/16 shows a T Score of +0.6; right hip neck at -2.0; left hip neck at -2.4.  She falls into the low Osteopenic range for her left hip.  In comparison to previous study from  08/23/14 there is -4% change at right hip and -9% change at left hip.  The FRAX score for major fracture is 27%  (goal is <20%);  The FRAX score for hip fracture in 10 yrs is 3 % (goal is < 3 %).  Her results are concerning but she needs knee replacement due to OA. This is most likely the reason for the decrease in BMD at the hip sites.  She has [facility] of Osteoporosis which is another risk factor.  It would be good for her to consider treatment options and discuss with MD.  See if she can come in to have a consult about results.  He husband has cancer and getting radiation treatments.    There needs to be a clarification about the FRAX score as  27% as noted vs. 18% as noted.  Please cal [facility] and clarify.

## 2016-10-18 NOTE — Telephone Encounter (Signed)
Left message for [facility]endy [facility] to discuss FRAX score from BMD dated 10/08/2016.  Left message for patient to call Bethany Cumming at [phone].

## 2016-10-18 NOTE — Telephone Encounter (Signed)
Spoke with patient. Advised of results as seen below from Toni CommentPatricia Grubb, FNP. Patient verbalizes understanding. BMD consult appointment scheduled for 10/24/2016 at 9:30 am with Dr.Silva. Patient is agreeable to date and time. Will keep encounter open to await return call from Toni Johnson to clarify FRAX score.

## 2016-10-24 ENCOUNTER — Ambulatory Visit (INDEPENDENT_AMBULATORY_CARE_PROVIDER_SITE_OTHER): Payer: BLUE CROSS/BLUE SHIELD | Admitting: Obstetrics and Gynecology

## 2016-10-24 ENCOUNTER — Encounter: Payer: Self-pay | Admitting: Obstetrics and Gynecology

## 2016-10-24 VITALS — BP 148/80 | HR 76 | Resp 16 | Ht 67.0 in | Wt 260.4 lb

## 2016-10-24 DIAGNOSIS — M8580 Other specified disorders of bone density and structure, unspecified site: Secondary | ICD-10-CM | POA: Diagnosis not present

## 2016-10-24 MED ORDER — ALENDRONATE SODIUM 70 MG PO TABS: 70.0000 mg | ORAL_TABLET | ORAL | 11 refills | Status: DC

## 2016-10-24 NOTE — Progress Notes (Signed)
GYNECOLOGY  VISIT   HPI: 65 y.o.   Married  Caucasian  female   G0P0000 with Patient's last menstrual period was 10/29/1997 (approximate).   here to discuss BMD results.    Results showed: T score left hip: -2.4, right hip: -2.0, spine: 0.6 FRAX score:  27% for major fracture, 3% for hip fracture.  Did a bone density at age 65 - 89 years old.  This was done due to mother's history of osteoporosis and vertebral fracture. She had a low BMI.  She had falls and multiple fractures.  Died following complications from fxs.  Has arthritis and knee pain.  Worried about this and other medication side effects.   Taking vit D supplementation and calcium.   No steroid use other than one course of prednisone.  Not a smoker ever.  No ETOH.  No hx malabsorption.  Had a fracture of right arm at age 89 due to trauma, and right foot at age 65 yo due to slippery floor.  Menopause in her mid 3940s.   GYNECOLOGIC HISTORY: Patient's last menstrual period was 10/29/1997 (approximate). Contraception:  Postmenopause Menopausal hormone therapy:  none Last mammogram:  10/08/16 BIRADS1 Density B, [facility] Last pap smear:  06/01/14 WNL neg HR HPV        OB History    Gravida Para Term Preterm AB Living   0 0 0 0 0 0   SAB TAB Ectopic Multiple Live Births   0 0 0 0 0         There are no active problems to display for this patient.   Past Medical History:  Diagnosis Date  . Arthritis   . Dysmenorrhea   . Osteopenia 2001  . Postmenopausal state 1999   HRT X 2 years  . Vitamin D deficiency disease 2009    Past Surgical History:  Procedure Laterality Date  . AUGMENTATION MAMMAPLASTY Bilateral 05/1982  . CRYOTHERAPY  1993   CIN I    Current Outpatient Prescriptions  Medication Sig Dispense Refill  . Acetaminophen (TYLENOL PO) Take by mouth as needed.    . Calcium Carbonate-Vitamin D (CALCIUM + D PO) Take by mouth.    Marland Kitchen. CINNAMON PO Take by mouth daily.    . fish oil-omega-3 fatty acids 1000  MG capsule Take 2 g by mouth daily.    . meloxicam (MOBIC) 15 MG tablet Take 15 mg by mouth daily. with food  2  . Multiple Vitamins-Minerals (MULTIVITAMIN PO) Take by mouth.    . NON FORMULARY GNC InstaFlex, once daily for joints    . Turmeric 500 MG TABS Take 2 tablets by mouth daily.    . Vitamin D, Ergocalciferol, (DRISDOL) 50000 units CAPS capsule TAKE 1 CAPSULE (50,000 UNITS TOTAL) BY MOUTH EVERY 7 (SEVEN) DAYS. 12 capsule 0   No current facility-administered medications for this visit.      ALLERGIES: Hydrocodone and Niacin and related  Family History  Problem Relation Age of Onset  . Hypertension Mother   . Osteoporosis Mother   . Osteoarthritis Mother   . Hypertension Father   . Diabetes Father   . Heart failure Father   . Diabetes Sister   . Colitis Sister   . Osteoarthritis Sister   . Heart disease Brother   . COPD Brother   . Lung cancer Brother   . Colitis Brother   . Osteoporosis Maternal Aunt   . Breast cancer Other 32  . Colitis Other     colostomy  .  Lupus Other     Social History   Social History  . Marital status: Married    Spouse name: N/A  . Number of children: N/A  . Years of education: N/A   Occupational History  . Not on file.   Social History Main Topics  . Smoking status: Never Smoker  . Smokeless tobacco: Never Used  . Alcohol use No  . Drug use: No  . Sexual activity: No   Other Topics Concern  . Not on file   Social History Narrative  . No narrative on file    ROS:  Pertinent items are noted in HPI.  PHYSICAL EXAMINATION:    BP (!) 148/80 (BP Location: Left Arm, Patient Position: Sitting, Cuff Size: Large)   Pulse 76   Resp 16   Ht 5\' 7"  (1.702 m)   Wt 260 lb 6.4 oz (118.1 kg)   LMP 10/29/1997 (Approximate)   BMI 40.78 kg/m     General appearance: alert, cooperative and appears stated age   ASSESSMENT  Osteopenia.  Increased risk of fracture by FRAX model.  Prior fractures in remote past. FH osteoporosis.     PLAN  Discussed recent BMD and risk of fracture.  Calcium and vitamin D discussed.  Reviewed pharmacologic tx of osteoporosis and osteopenia. Will treat with Fosamax 70 mg weekly.  Discussed potential side effects.  Discussed reduction of fall risk.  Follow up in 4 weeks.  Next BMD in 2 years.    An After Visit Summary was printed and given to the patient.  __25____ minutes face to face time of which over 50% was spent in counseling.

## 2016-10-25 NOTE — Telephone Encounter (Signed)
Spoke with [facility]inaldo [facility]Wendy [facility] who has reviewed the patient's FRAX scores. Per Toniann FailWendy the FRAX score of 27 % is the total fracture risk for both hips and spine. FRAX score for left hip is 18 % and the FRAX score for the right hip is 16%. BMD to scan.  Cc: Dr.Silva  Routing to provider for final review. Patient agreeable to disposition. Will close encounter.

## 2016-10-28 ENCOUNTER — Encounter: Payer: Self-pay | Admitting: Obstetrics and Gynecology

## 2016-11-01 NOTE — Addendum Note (Signed)
Addended by: Francee PiccoloPHILLIPS, Jarae Panas C on: 11/01/2016 10:45 AM   Modules accepted: Orders

## 2016-11-01 NOTE — Addendum Note (Signed)
Addended by: Luisa DagoPHILLIPS, STEPHANIE C on: 11/01/2016 10:44 AM   Modules accepted: Orders

## 2016-11-19 ENCOUNTER — Telehealth: Payer: Self-pay | Admitting: Obstetrics and Gynecology

## 2016-11-19 NOTE — Telephone Encounter (Signed)
Left message to call Malachai Schalk at [phone]. 

## 2016-11-19 NOTE — Telephone Encounter (Signed)
Patient is calling to give information regarding her Fosamax prescription. Patient stopped taking the Fosamax due to stiff joints.

## 2016-11-19 NOTE — Telephone Encounter (Signed)
Spoke with patient. Patient states that she took two doses of Fosamax and has been having increased joint stiffness. "I have arthritis already and this made it worse. I could hardly walk." Patient has been taking Fosamax on Sunday. Skipped dose yesterday. Reports starting to feel better since not taking medication. Patient would like Dr.Silva's recommendation on if she should try an alternative medication at this time or if she is okay not to start a new medicine. Advised will review with Dr.Silva and return call with further recommendations. Patient is agreeable.

## 2016-11-19 NOTE — Telephone Encounter (Signed)
Other options for treatment of osteopenia are Evista orally daily and Prolia injection twice a year. All of these medications have the potential to cause arthritic type pain.  Her other choice is to not treat and recheck her bone density in 2 years and reassess at that time.  I am happy to have her return for further discussion if she would like.

## 2016-11-20 NOTE — Telephone Encounter (Signed)
Spoke with patient. Advised of message as seen below from Dr.Silva. Patient verbalizes understanding. Patient would like to not treat osteopenia at this time and recheck BMD in 2 years. Patient will continue with calcium and vitamin D supplements. Will start new weight bearing activities that she feel comfortable with as she has trouble with her knees such as walking and lifting light weights. Patient will contact the office if she would like to see Dr.Silva for further consultation regarding alternative medication options.  Routing to provider for final review. Patient agreeable to disposition. Will close encounter.

## 2016-11-20 NOTE — Telephone Encounter (Signed)
Left message to call Zakaria Fromer at [phone]. 

## 2016-11-23 ENCOUNTER — Ambulatory Visit: Payer: BLUE CROSS/BLUE SHIELD | Admitting: Obstetrics and Gynecology

## 2016-11-28 ENCOUNTER — Ambulatory Visit: Payer: BLUE CROSS/BLUE SHIELD | Admitting: Obstetrics and Gynecology

## 2017-05-07 ENCOUNTER — Telehealth: Payer: Self-pay | Admitting: Obstetrics & Gynecology

## 2017-05-07 NOTE — Telephone Encounter (Signed)
Left message on voicemail to call and reschedule cancelled appointment. Mail letter °

## 2017-06-21 ENCOUNTER — Ambulatory Visit: Payer: BLUE CROSS/BLUE SHIELD | Admitting: Nurse Practitioner

## 2018-08-19 ENCOUNTER — Other Ambulatory Visit: Payer: Self-pay | Admitting: Gastroenterology

## 2018-08-19 DIAGNOSIS — R112 Nausea with vomiting, unspecified: Secondary | ICD-10-CM

## 2018-09-08 ENCOUNTER — Encounter (HOSPITAL_COMMUNITY): Payer: Self-pay

## 2018-09-08 ENCOUNTER — Encounter (HOSPITAL_COMMUNITY)
Admission: RE | Admit: 2018-09-08 | Discharge: 2018-09-08 | Disposition: A | Discharge status: Home / Self Care | Payer: BLUE CROSS/BLUE SHIELD | Source: Ambulatory Visit | Attending: Gastroenterology | Admitting: Gastroenterology

## 2018-09-08 ENCOUNTER — Ambulatory Visit (HOSPITAL_COMMUNITY)
Admission: RE | Admit: 2018-09-08 | Discharge: 2018-09-08 | Disposition: A | Discharge status: Home / Self Care | Payer: BLUE CROSS/BLUE SHIELD | Source: Ambulatory Visit | Attending: Gastroenterology | Admitting: Gastroenterology

## 2018-09-08 DIAGNOSIS — R112 Nausea with vomiting, unspecified: Secondary | ICD-10-CM

## 2018-09-08 DIAGNOSIS — I77811 Abdominal aortic ectasia: Secondary | ICD-10-CM | POA: Diagnosis not present

## 2018-09-08 DIAGNOSIS — K8020 Calculus of gallbladder without cholecystitis without obstruction: Secondary | ICD-10-CM | POA: Diagnosis not present

## 2018-09-08 DIAGNOSIS — R932 Abnormal findings on diagnostic imaging of liver and biliary tract: Secondary | ICD-10-CM | POA: Insufficient documentation

## 2018-09-11 ENCOUNTER — Other Ambulatory Visit: Payer: Self-pay | Admitting: Gastroenterology

## 2018-09-11 DIAGNOSIS — R933 Abnormal findings on diagnostic imaging of other parts of digestive tract: Secondary | ICD-10-CM

## 2018-09-21 ENCOUNTER — Ambulatory Visit
Admission: RE | Admit: 2018-09-21 | Discharge: 2018-09-21 | Disposition: A | Discharge status: Home / Self Care | Payer: BLUE CROSS/BLUE SHIELD | Source: Ambulatory Visit | Attending: Gastroenterology | Admitting: Gastroenterology

## 2018-09-21 DIAGNOSIS — R933 Abnormal findings on diagnostic imaging of other parts of digestive tract: Secondary | ICD-10-CM

## 2018-09-21 MED ORDER — GADOBENATE DIMEGLUMINE 529 MG/ML IV SOLN
20.0000 mL | Freq: Once | INTRAVENOUS | Status: AC | PRN
  Administered 2018-09-21: 20 mL via INTRAVENOUS

## 2018-09-23 ENCOUNTER — Other Ambulatory Visit: Payer: Self-pay | Admitting: Gastroenterology

## 2018-10-16 ENCOUNTER — Encounter (HOSPITAL_COMMUNITY): Payer: Self-pay | Admitting: *Deleted

## 2018-10-17 ENCOUNTER — Ambulatory Visit (HOSPITAL_COMMUNITY): Payer: BLUE CROSS/BLUE SHIELD | Admitting: Anesthesiology

## 2018-10-17 ENCOUNTER — Encounter (HOSPITAL_COMMUNITY): Payer: Self-pay | Admitting: *Deleted

## 2018-10-17 ENCOUNTER — Ambulatory Visit (HOSPITAL_COMMUNITY): Payer: BLUE CROSS/BLUE SHIELD

## 2018-10-17 ENCOUNTER — Other Ambulatory Visit: Payer: Self-pay

## 2018-10-17 ENCOUNTER — Encounter (HOSPITAL_COMMUNITY)
Admission: RE | Disposition: A | Discharge status: Home / Self Care | Payer: Self-pay | Source: Home / Self Care | Attending: Gastroenterology

## 2018-10-17 ENCOUNTER — Ambulatory Visit (HOSPITAL_COMMUNITY)
Admission: RE | Admit: 2018-10-17 | Discharge: 2018-10-17 | Disposition: A | Discharge status: Home / Self Care | Payer: BLUE CROSS/BLUE SHIELD | Attending: Gastroenterology | Admitting: Gastroenterology

## 2018-10-17 DIAGNOSIS — N946 Dysmenorrhea, unspecified: Secondary | ICD-10-CM | POA: Insufficient documentation

## 2018-10-17 DIAGNOSIS — Z825 Family history of asthma and other chronic lower respiratory diseases: Secondary | ICD-10-CM | POA: Insufficient documentation

## 2018-10-17 DIAGNOSIS — Z885 Allergy status to narcotic agent status: Secondary | ICD-10-CM | POA: Insufficient documentation

## 2018-10-17 DIAGNOSIS — M1990 Unspecified osteoarthritis, unspecified site: Secondary | ICD-10-CM | POA: Insufficient documentation

## 2018-10-17 DIAGNOSIS — Z8489 Family history of other specified conditions: Secondary | ICD-10-CM | POA: Insufficient documentation

## 2018-10-17 DIAGNOSIS — Z833 Family history of diabetes mellitus: Secondary | ICD-10-CM | POA: Diagnosis not present

## 2018-10-17 DIAGNOSIS — K8070 Calculus of gallbladder and bile duct without cholecystitis without obstruction: Secondary | ICD-10-CM | POA: Insufficient documentation

## 2018-10-17 DIAGNOSIS — Z8262 Family history of osteoporosis: Secondary | ICD-10-CM | POA: Diagnosis not present

## 2018-10-17 DIAGNOSIS — Z803 Family history of malignant neoplasm of breast: Secondary | ICD-10-CM | POA: Insufficient documentation

## 2018-10-17 DIAGNOSIS — Z8261 Family history of arthritis: Secondary | ICD-10-CM | POA: Diagnosis not present

## 2018-10-17 DIAGNOSIS — M8580 Other specified disorders of bone density and structure, unspecified site: Secondary | ICD-10-CM | POA: Insufficient documentation

## 2018-10-17 DIAGNOSIS — Z888 Allergy status to other drugs, medicaments and biological substances status: Secondary | ICD-10-CM | POA: Diagnosis not present

## 2018-10-17 DIAGNOSIS — I10 Essential (primary) hypertension: Secondary | ICD-10-CM | POA: Insufficient documentation

## 2018-10-17 DIAGNOSIS — E559 Vitamin D deficiency, unspecified: Secondary | ICD-10-CM | POA: Insufficient documentation

## 2018-10-17 DIAGNOSIS — K8050 Calculus of bile duct without cholangitis or cholecystitis without obstruction: Secondary | ICD-10-CM

## 2018-10-17 DIAGNOSIS — Z8249 Family history of ischemic heart disease and other diseases of the circulatory system: Secondary | ICD-10-CM | POA: Insufficient documentation

## 2018-10-17 HISTORY — PX: REMOVAL OF STONES: SHX5545

## 2018-10-17 HISTORY — PX: SPHINCTEROTOMY: SHX5279

## 2018-10-17 HISTORY — PX: ENDOSCOPIC RETROGRADE CHOLANGIOPANCREATOGRAPHY (ERCP) WITH PROPOFOL: SHX5810

## 2018-10-17 SURGERY — ENDOSCOPIC RETROGRADE CHOLANGIOPANCREATOGRAPHY (ERCP) WITH PROPOFOL
Anesthesia: General

## 2018-10-17 MED ORDER — ROCURONIUM BROMIDE 10 MG/ML (PF) SYRINGE
PREFILLED_SYRINGE | INTRAVENOUS | Status: DC | PRN
  Administered 2018-10-17: 50 mg via INTRAVENOUS

## 2018-10-17 MED ORDER — FENTANYL CITRATE (PF) 100 MCG/2ML IJ SOLN
INTRAMUSCULAR | Status: DC | PRN
  Administered 2018-10-17 (×2): 50 ug via INTRAVENOUS

## 2018-10-17 MED ORDER — LIDOCAINE 2% (20 MG/ML) 5 ML SYRINGE
INTRAMUSCULAR | Status: DC | PRN
  Administered 2018-10-17: 80 mg via INTRAVENOUS

## 2018-10-17 MED ORDER — CIPROFLOXACIN IN D5W 400 MG/200ML IV SOLN
INTRAVENOUS | Status: AC
  Filled 2018-10-17: qty 200

## 2018-10-17 MED ORDER — FENTANYL CITRATE (PF) 100 MCG/2ML IJ SOLN
INTRAMUSCULAR | Status: AC
  Filled 2018-10-17: qty 2

## 2018-10-17 MED ORDER — SODIUM CHLORIDE 0.9 % IV SOLN: INTRAVENOUS | Status: DC

## 2018-10-17 MED ORDER — SODIUM CHLORIDE 0.9 % IV SOLN
INTRAVENOUS | Status: DC | PRN
  Administered 2018-10-17: 30 mL

## 2018-10-17 MED ORDER — INDOMETHACIN 50 MG RE SUPP
RECTAL | Status: AC
  Filled 2018-10-17: qty 2

## 2018-10-17 MED ORDER — INDOMETHACIN 50 MG RE SUPP
RECTAL | Status: DC | PRN
  Administered 2018-10-17: 100 mg via RECTAL

## 2018-10-17 MED ORDER — GLUCAGON HCL RDNA (DIAGNOSTIC) 1 MG IJ SOLR
INTRAMUSCULAR | Status: AC
  Filled 2018-10-17: qty 1

## 2018-10-17 MED ORDER — DEXAMETHASONE SODIUM PHOSPHATE 10 MG/ML IJ SOLN
INTRAMUSCULAR | Status: DC | PRN
  Administered 2018-10-17: 10 mg via INTRAVENOUS

## 2018-10-17 MED ORDER — CIPROFLOXACIN IN D5W 400 MG/200ML IV SOLN
INTRAVENOUS | Status: DC | PRN
  Administered 2018-10-17: 400 mg via INTRAVENOUS

## 2018-10-17 MED ORDER — SUGAMMADEX SODIUM 200 MG/2ML IV SOLN
INTRAVENOUS | Status: DC | PRN
  Administered 2018-10-17: 204.2 mg via INTRAVENOUS

## 2018-10-17 MED ORDER — ONDANSETRON HCL 4 MG/2ML IJ SOLN
INTRAMUSCULAR | Status: DC | PRN
  Administered 2018-10-17: 4 mg via INTRAVENOUS

## 2018-10-17 MED ORDER — PROPOFOL 10 MG/ML IV BOLUS
INTRAVENOUS | Status: DC | PRN
  Administered 2018-10-17: 130 mg via INTRAVENOUS

## 2018-10-17 MED ORDER — LACTATED RINGERS IV SOLN
INTRAVENOUS | Status: DC
  Administered 2018-10-17: 1000 mL via INTRAVENOUS

## 2018-10-17 NOTE — Transfer of Care (Signed)
Immediate Anesthesia Transfer of Care Note  Patient: Toni SalinasRachel G Johnson  Procedure(s) Performed: Procedure(s) with comments: ENDOSCOPIC RETROGRADE CHOLANGIOPANCREATOGRAPHY (ERCP) WITH PROPOFOL (N/A) SPHINCTEROTOMY - balloon sweep  Patient Location: PACU  Anesthesia Type:General  Level of Consciousness:  sedated, patient cooperative and responds to stimulation  Airway & Oxygen Therapy:Patient Spontanous Breathing and Patient connected to face mask oxgen  Post-op Assessment:  Report given to PACU RN and Post -op Vital signs reviewed and stable  Post vital signs:  Reviewed and stable  Last Vitals:  Vitals:   10/17/18 1153  Pulse: 77  Resp: 10  Temp: 36.6 C  SpO2: 100%    Complications: No apparent anesthesia complications

## 2018-10-17 NOTE — Anesthesia Procedure Notes (Signed)
Procedure Name: Intubation Date/Time: 10/17/2018 1:40 PM Performed by: Lavina Hamman, CRNA Pre-anesthesia Checklist: Patient identified, Emergency Drugs available, Suction available, Patient being monitored and Timeout performed Patient Re-evaluated:Patient Re-evaluated prior to induction Oxygen Delivery Method: Circle system utilized Preoxygenation: Pre-oxygenation with 100% oxygen Induction Type: IV induction Ventilation: Mask ventilation without difficulty Laryngoscope Size: Mac and 4 Grade View: Grade II Tube type: Oral Tube size: 7.0 mm Number of attempts: 1 Airway Equipment and Method: Stylet Placement Confirmation: ETT inserted through vocal cords under direct vision,  positive ETCO2,  CO2 detector and breath sounds checked- equal and bilateral Secured at: 22 cm Tube secured with: Tape Dental Injury: Teeth and Oropharynx as per pre-operative assessment

## 2018-10-17 NOTE — Op Note (Signed)
[facility] Patient Name: Francies Inch Procedure Date: 10/17/2018 MRN: [id number] Attending MD: Jeani Hawking , MD Date of Birth: 10-02-1951 CSN: [id number] Age: 67 Admit Type: Inpatient Procedure:                ERCP Indications:              Common bile duct stone(s) Providers:                Jeani Hawking, MD, Dwain Sarna, RN, Kandice Robinsons, Technician Referring MD:              Medicines:                General Anesthesia Complications:            No immediate complications. Estimated Blood Loss:     Estimated blood loss: none. Procedure:                Pre-Anesthesia Assessment:                           - Prior to the procedure, a History and Physical                            was performed, and patient medications and                            allergies were reviewed. The patient's tolerance of                            previous anesthesia was also reviewed. The risks                            and benefits of the procedure and the sedation                            options and risks were discussed with the patient.                            All questions were answered, and informed consent                            was obtained. Prior Anticoagulants: The patient has                            taken no previous anticoagulant or antiplatelet                            agents. ASA Grade Assessment: II - A patient with                            mild systemic disease. After reviewing the risks                            and benefits,  the patient was deemed in                            satisfactory condition to undergo the procedure.                           - Sedation was administered by an anesthesia                            professional. General anesthesia was attained.                           After obtaining informed consent, the scope was                            passed under direct vision. Throughout the                     procedure, the patient's blood pressure, pulse, and                            oxygen saturations were monitored continuously. The                            [id number]-Q180V ([id number]) Olympus ERCP was introduced                            through the mouth, and used to inject contrast into                            and used to inject contrast into the bile duct. The                            ERCP was accomplished without difficulty. The                            patient tolerated the procedure well. Scope In: Scope Out: Findings:      The major papilla was normal. The bile duct was deeply cannulated with       the short-nosed traction sphincterotome. Contrast was injected. I       personally interpreted the bile duct images. There was brisk flow of       contrast through the ducts. Image quality was excellent. Contrast       extended to the hepatic ducts. The main bile duct was normal. A short       0.035 inch Soft Jagwire was passed into the biliary tree. A 10 mm       biliary sphincterotomy was made with a monofilament traction (standard)       sphincterotome using ERBE electrocautery. There was no       post-sphincterotomy bleeding. The biliary tree was swept with a 12 mm       balloon starting at the bifurcation. One stone was removed. No stones       remained.      No stones were noted on the fluoroscopy, but there were a couple of       stone  fragments that were removed. Impression:               - The major papilla appeared normal.                           - Choledocholithiasis was found. Complete removal                            was accomplished by biliary sphincterotomy and                            balloon extraction.                           - A biliary sphincterotomy was performed.                           - The biliary tree was swept. Moderate Sedation:      Not Applicable - Patient had care per Anesthesia. Recommendation:           - Patient has a  contact number available for                            emergencies. The signs and symptoms of potential                            delayed complications were discussed with the                            patient. Return to normal activities tomorrow.                            Written discharge instructions were provided to the                            patient.                           - Resume regular diet.                           - Surgical evaluation for lap chole. Procedure Code(s):        --- Professional ---                           [id number], Endoscopic retrograde                            cholangiopancreatography (ERCP); with removal of                            calculi/debris from biliary/pancreatic duct(s)                           43262, Endoscopic retrograde  cholangiopancreatography (ERCP); with                            sphincterotomy/papillotomy                           [phone], Endoscopic catheterization of the biliary                            ductal system, radiological supervision and                            interpretation Diagnosis Code(s):        --- Professional ---                           K80.50, Calculus of bile duct without cholangitis                            or cholecystitis without obstruction CPT copyright 2018 American Medical Association. All rights reserved. The codes documented in this report are preliminary and upon coder review may  be revised to meet current compliance requirements. Jeani HawkingPatrick Alizaya Oshea, MD Jeani HawkingPatrick Latacha Texeira, MD 10/17/2018 1:56:23 PM This report has been signed electronically. Number of Addenda: 0

## 2018-10-17 NOTE — Discharge Instructions (Signed)

## 2018-10-17 NOTE — H&P (Signed)
  Toni Johnson HPI:  A recent MRCP was positive for two small stones in the CBD in the setting of cholelithiasis.  There CBD measured 8 mm.  She has a history of RUQ pain.  Past Medical History:  Diagnosis Date  . Arthritis   . Dysmenorrhea   . Osteopenia 2001  . Postmenopausal state 1999   HRT X 2 years.  Fosamax started Dec. 2017 due to FRAX risk.  . Vitamin D deficiency disease 2009    Past Surgical History:  Procedure Laterality Date  . AUGMENTATION MAMMAPLASTY Bilateral 05/1982  . CRYOTHERAPY  1993   CIN I    Family History  Problem Relation Age of Onset  . Hypertension Mother   . Osteoporosis Mother   . Osteoarthritis Mother   . Hypertension Father   . Diabetes Father   . Heart failure Father   . Diabetes Sister   . Colitis Sister   . Osteoarthritis Sister   . Heart disease Brother   . COPD Brother   . Lung cancer Brother   . Colitis Brother   . Osteoporosis Maternal Aunt   . Breast cancer Other 32  . Colitis Other        colostomy  . Lupus Other     Social History:  reports that she has never smoked. She has never used smokeless tobacco. She reports that she does not drink alcohol or use drugs.  Allergies:  Allergies  Allergen Reactions  . Hydrocodone Nausea And Vomiting  . Niacin And Related     Medications:  Scheduled:  Continuous: . sodium chloride    . lactated ringers 1,000 mL (10/17/18 1203)    No results found for this or any previous visit (from the past 24 hour(s)).   No results found.  ROS:  As stated above in the HPI otherwise negative.  Pulse 77, temperature 97.8 F (36.6 C), temperature source Oral, resp. rate 10, height 5\' 7"  (1.702 m), weight 102.1 kg, last menstrual period 10/29/1997, SpO2 100 %.    PE: Gen: NAD, Alert and Oriented HEENT:  [place]/AT, EOMI Neck: Supple, no LAD Lungs: CTA Bilaterally CV: RRR without M/G/R ABM: Soft, NTND, +BS Ext: No C/C/E  Assessment/Plan: 1) Choledocholithiasis - ERCP with stone  extraction.  Synai Prettyman D 10/17/2018, 12:58 PM

## 2018-10-17 NOTE — Anesthesia Preprocedure Evaluation (Signed)
Anesthesia Evaluation  Patient identified by MRN, date of birth, ID band Patient awake    Reviewed: Allergy & Precautions, H&P , NPO status , Patient's Chart, lab work & pertinent test results, reviewed documented beta blocker date and time   Airway Mallampati: II  TM Distance: >3 FB Neck ROM: full    Dental no notable dental hx.    Pulmonary neg pulmonary ROS,    Pulmonary exam normal breath sounds clear to auscultation       Cardiovascular Exercise Tolerance: Good hypertension, Pt. on medications  Rhythm:regular Rate:Normal     Neuro/Psych negative neurological ROS  negative psych ROS   GI/Hepatic negative GI ROS, Neg liver ROS,   Endo/Other  negative endocrine ROS  Renal/GU negative Renal ROS  negative genitourinary   Musculoskeletal  (+) Arthritis , Osteoarthritis,    Abdominal   Peds  Hematology negative hematology ROS (+)   Anesthesia Other Findings   Reproductive/Obstetrics negative OB ROS                             Anesthesia Physical Anesthesia Plan  ASA: III  Anesthesia Plan: General   Post-op Pain Management:    Induction: Intravenous  PONV Risk Score and Plan: 3 and Ondansetron and Treatment may vary due to age or medical condition  Airway Management Planned: Oral ETT  Additional Equipment:   Intra-op Plan:   Post-operative Plan: Extubation in OR  Informed Consent: I have reviewed the patients History and Physical, chart, labs and discussed the procedure including the risks, benefits and alternatives for the proposed anesthesia with the patient or authorized representative who has indicated his/her understanding and acceptance.   Dental Advisory Given  Plan Discussed with: CRNA and Anesthesiologist  Anesthesia Plan Comments: (  )        Anesthesia Quick Evaluation

## 2018-10-19 ENCOUNTER — Encounter (HOSPITAL_COMMUNITY): Payer: Self-pay | Admitting: Gastroenterology

## 2018-10-20 NOTE — Anesthesia Postprocedure Evaluation (Signed)
Anesthesia Post Note  Patient: Toni SalinasRachel G Johnson  Procedure(s) Performed: ENDOSCOPIC RETROGRADE CHOLANGIOPANCREATOGRAPHY (ERCP) WITH PROPOFOL (N/A ) SPHINCTEROTOMY REMOVAL OF STONES     Patient location during evaluation: PACU Anesthesia Type: General Level of consciousness: awake and alert Pain management: pain level controlled Vital Signs Assessment: post-procedure vital signs reviewed and stable Respiratory status: spontaneous breathing, nonlabored ventilation, respiratory function stable and patient connected to nasal cannula oxygen Cardiovascular status: blood pressure returned to baseline and stable Postop Assessment: no apparent nausea or vomiting Anesthetic complications: no    Last Vitals:  Vitals:   10/17/18 1420 10/17/18 1430  BP: (!) 144/76 138/69  Pulse: 69 68  Resp: 15 13  Temp:    SpO2: 98% 100%    Last Pain:  Vitals:   10/17/18 1408  TempSrc:   PainSc: 4                  Lanijah Warzecha

## 2018-11-04 ENCOUNTER — Other Ambulatory Visit: Payer: Self-pay | Admitting: Surgery

## 2018-11-11 NOTE — Pre-Procedure Instructions (Signed)
Toni Johnson  11/11/2018      [id number] - [place], [place] - [address] AT [facility] OF [address] [facility] [address] & [address] [place] [address] Phone: [phone] Fax: [phone]    Your procedure is scheduled on 11/18/2018.  Report to [facility] Admitting at 0530 A.M.  Call this number if you have problems the morning of surgery:  [phone]   Remember:  Do not drink after midnight.  You may drink clear liquids until 0430 .  Clear liquids allowed are:  Water, Juice (non-citric and without pulp), Carbonated beverages, Clear Tea, Black Coffee only and Gatorade    Take these medicines the morning of surgery with A SIP OF WATER: Duloxetine (Cymbalta) Tapentadol (Nucynta)  7 days prior to surgery STOP taking any Meloxicam (Mobic), Aspirin (unless otherwise instructed by your surgeon), Aleve, Naproxen, Ibuprofen, Motrin, Advil, Goody's, BC's, all herbal medications (including Cinnamon & Osteo-Biflex), fish oil, and all vitamins.     Do not wear jewelry, make-up or nail polish.  Do not wear lotions, powders, or perfumes, or deodorant.  Do not shave 48 hours prior to surgery.  Men may shave face and neck.  Do not bring valuables to the hospital.  [facility] is not responsible for any belongings or valuables.  Contacts, eyeglasses, hearing aids, dentures or bridgework may not be worn into surgery.  Leave your suitcase in the car.  After surgery it may be brought to your room.  For patients admitted to the hospital, discharge time will be determined by your treatment team.  Patients discharged the day of surgery will not be allowed to drive home.   Name and phone number of your driver:    Special instructions:   [facility]- Preparing For Surgery  Before surgery, you can play an important role. Because skin is not sterile, your skin needs to be as free of germs as possible. You can reduce the number of germs on  your skin by washing with CHG (chlorahexidine gluconate) Soap before surgery.  CHG is an antiseptic cleaner which kills germs and bonds with the skin to continue killing germs even after washing.    Oral Hygiene is also important to reduce your risk of infection.  Remember - BRUSH YOUR TEETH THE MORNING OF SURGERY WITH YOUR REGULAR TOOTHPASTE  Please do not use if you have an allergy to CHG or antibacterial soaps. If your skin becomes reddened/irritated stop using the CHG.  Do not shave (including legs and underarms) for at least 48 hours prior to first CHG shower. It is OK to shave your face.  Please follow these instructions carefully.   1. Shower the NIGHT BEFORE SURGERY and the MORNING OF SURGERY with CHG.   2. If you chose to wash your hair, wash your hair first as usual with your normal shampoo.  3. After you shampoo, rinse your hair and body thoroughly to remove the shampoo.  4. Use CHG as you would any other liquid soap. You can apply CHG directly to the skin and wash gently with a scrungie or a clean washcloth.   5. Apply the CHG Soap to your body ONLY FROM THE NECK DOWN.  Do not use on open wounds or open sores. Avoid contact with your eyes, ears, mouth and genitals (private parts). Wash Face and genitals (private parts)  with your normal soap.  6. Wash thoroughly, paying special attention to the area where your surgery will  be performed.  7. Thoroughly rinse your body with warm water from the neck down.  8. DO NOT shower/wash with your normal soap after using and rinsing off the CHG Soap.  9. Pat yourself dry with a CLEAN TOWEL.  10. Wear CLEAN PAJAMAS to bed the night before surgery, wear comfortable clothes the morning of surgery  11. Place CLEAN SHEETS on your bed the night of your first shower and DO NOT SLEEP WITH PETS.    Day of Surgery: Shower as stated above. Do not apply any deodorants/lotions.  Please wear clean clothes to the hospital/surgery center.    Remember to brush your teeth WITH YOUR REGULAR TOOTHPASTE.    Please read over the following fact sheets that you were given.

## 2018-11-12 ENCOUNTER — Encounter (HOSPITAL_COMMUNITY): Payer: Self-pay

## 2018-11-12 ENCOUNTER — Other Ambulatory Visit: Payer: Self-pay

## 2018-11-12 ENCOUNTER — Encounter (HOSPITAL_COMMUNITY)
Admission: RE | Admit: 2018-11-12 | Discharge: 2018-11-12 | Disposition: A | Discharge status: Home / Self Care | Payer: BLUE CROSS/BLUE SHIELD | Source: Ambulatory Visit | Attending: Surgery | Admitting: Surgery

## 2018-11-12 DIAGNOSIS — Z01818 Encounter for other preprocedural examination: Secondary | ICD-10-CM | POA: Diagnosis present

## 2018-11-12 HISTORY — DX: Essential (primary) hypertension: I10

## 2018-11-12 HISTORY — DX: Myoneural disorder, unspecified: G70.9

## 2018-11-12 HISTORY — DX: Thoracic aortic ectasia: I77.810

## 2018-11-12 LAB — BASIC METABOLIC PANEL
Anion gap: 8 (ref 5–15)
BUN: 13 mg/dL (ref 8–23)
CHLORIDE: 104 mmol/L (ref 98–111)
CO2: 26 mmol/L (ref 22–32)
Calcium: 9.1 mg/dL (ref 8.9–10.3)
Creatinine, Ser: 0.76 mg/dL (ref 0.44–1.00)
GFR calc Af Amer: >60 mL/min (ref >60)
GFR calc non Af Amer: >60 mL/min (ref >60)
Glucose, Bld: 98 mg/dL (ref 70–99)
POTASSIUM: 4.2 mmol/L (ref 3.5–5.1)
Sodium: 138 mmol/L (ref 135–145)

## 2018-11-12 LAB — CBC
HCT: 37.8 % (ref 36.0–46.0)
HEMOGLOBIN: 12 g/dL (ref 12.0–15.0)
MCH: 31.4 pg (ref 26.0–34.0)
MCHC: 31.7 g/dL (ref 30.0–36.0)
MCV: 99 fL (ref 80.0–100.0)
Platelets: 244 10*3/uL (ref 150–400)
RBC: 3.82 MIL/uL — ABNORMAL LOW (ref 3.87–5.11)
RDW: 12.9 % (ref 11.5–15.5)
WBC: 4.5 10*3/uL (ref 4.0–10.5)
nRBC: 0 % (ref 0.0–0.2)

## 2018-11-12 NOTE — Progress Notes (Signed)
PCP - Dr. Nadyne Coombes MD  Chest x-ray - N/A EKG - 11/12/2018  Blood Thinner Instructions: N/A Aspirin Instructions: N/A  Anesthesia review: none  Patient denies shortness of breath, fever, cough and chest pain at PAT appointment   Patient verbalized understanding of instructions that were given to them at the PAT appointment. Patient was also instructed that they will need to review over the PAT instructions again at home before surgery.

## 2018-11-17 NOTE — Anesthesia Preprocedure Evaluation (Addendum)
Anesthesia Evaluation  Patient identified by MRN, date of birth, ID band Patient awake    Reviewed: Allergy & Precautions, H&P , NPO status , Unable to perform ROS - Chart review only  Airway Mallampati: II  TM Distance: >3 FB Neck ROM: Full    Dental no notable dental hx. (+) Teeth Intact, Dental Advisory Given   Pulmonary neg pulmonary ROS,    Pulmonary exam normal breath sounds clear to auscultation       Cardiovascular hypertension, Pt. on medications Normal cardiovascular exam Rhythm:Regular Rate:Normal  EKG 11-12-2018 SR R 68   Neuro/Psych  Neuromuscular disease negative psych ROS   GI/Hepatic Neg liver ROS,   Endo/Other  negative endocrine ROS  Renal/GU negative Renal ROS     Musculoskeletal  (+) Arthritis ,   Abdominal (+) + obese,   Peds  Hematology negative hematology ROS (+)   Anesthesia Other Findings   Reproductive/Obstetrics                            Lab Results  Component Value Date   WBC 4.5 11/12/2018   HGB 12.0 11/12/2018   HCT 37.8 11/12/2018   MCV 99.0 11/12/2018   PLT 244 11/12/2018    Anesthesia Physical Anesthesia Plan  ASA: II  Anesthesia Plan: General   Post-op Pain Management:    Induction: Intravenous  PONV Risk Score and Plan: 4 or greater and Treatment may vary due to age or medical condition, Dexamethasone, Ondansetron and Midazolam  Airway Management Planned: Oral ETT  Additional Equipment:   Intra-op Plan:   Post-operative Plan: Extubation in OR  Informed Consent: I have reviewed the patients History and Physical, chart, labs and discussed the procedure including the risks, benefits and alternatives for the proposed anesthesia with the patient or authorized representative who has indicated his/her understanding and acceptance.     Dental advisory given  Plan Discussed with:   Anesthesia Plan Comments: (For lap chole)         Anesthesia Quick Evaluation

## 2018-11-17 NOTE — H&P (Signed)
Toni Johnson Documented: 11/04/2018 11:31 AM Location: [facility] Patient #: [id number] DOB: 1950/11/04 Married / Language: Undefined / Race: White Female   History of Present Illness (Toni Metzner A. Magnus Ivan MD; 11/04/2018 11:54 AM) The patient is a 68 year old female who presents for evaluation of gall stones. This patient is referred by Dr. Charna Elizabeth for evaluation of symptomatic gallstones. She had actually had multiple recent attacks of biliary colic. She ended up having an ultrasound showing gallstones. She had a follow-up MRCP because of elevated liver function test showing stones in the bile duct. She then had an ERCP performed as an outpatient for Christmas. Now, she reports she is feeling well. She has been on a low-fat diet. She is referred here for consideration of proceeding with a cholecystectomy. She is currently otherwise without complaints and doing well.   Past Surgical History Toni Johnson, CMA; 11/04/2018 11:50 AM) Breast Augmentation  Bilateral.  Diagnostic Studies History Toni Johnson, CMA; 11/04/2018 11:50 AM) Colonoscopy  1-5 years ago Mammogram  1-3 years ago  Allergies Toni Johnson, CMA; 11/04/2018 11:33 AM) Nystatin *ANTIFUNGALS*  Allergies Reconciled   Medication History Toni Johnson, CMA; 11/04/2018 11:33 AM) Meloxicam (15MG  Tablet, Oral) Active. Lisinopril (5MG  Tablet, Oral) Active. Nucynta ER (50MG  Tablet ER 12HR, Oral) Active. DULoxetine HCl (60MG  Capsule DR Part, Oral) Active. Medications Reconciled  Social History Toni Johnson, CMA; 11/04/2018 11:50 AM) Caffeine use  Coffee, Tea. No alcohol use  Tobacco use  Never smoker.  Family History Toni Johnson, CMA; 11/04/2018 11:50 AM) Arthritis  Brother, Mother, Sister. Cancer  Brother. Cerebrovascular Accident  Brother, Father, Mother. Diabetes Mellitus  Brother, Father, Sister. Heart Disease  Brother, Father. Heart disease in female family member before age 97   Hypertension  Brother, Father, Mother. Respiratory Condition  Brother.  Pregnancy / Birth History Toni Johnson, CMA; 11/04/2018 11:50 AM) Age at menarche  12 years. Age of menopause  5-50 Gravida  0 Para  0  Other Problems Toni Johnson, CMA; 11/04/2018 11:50 AM) Arthritis  Cholelithiasis  High blood pressure     Review of Systems (Toni Johnson CMA; 11/04/2018 11:50 AM) Skin Not Present- Change in Wart/Mole, Dryness, Hives, Jaundice, New Lesions, Non-Healing Wounds, Rash and Ulcer. HEENT Present- Wears glasses/contact lenses. Not Present- Earache, Hearing Loss, Hoarseness, Nose Bleed, Oral Ulcers, Ringing in the Ears, Seasonal Allergies, Sinus Pain, Sore Throat, Visual Disturbances and Yellow Eyes. Respiratory Present- Snoring. Not Present- Bloody sputum, Chronic Cough, Difficulty Breathing and Wheezing. Breast Not Present- Breast Mass, Breast Pain, Nipple Discharge and Skin Changes. Cardiovascular Not Present- Chest Pain, Difficulty Breathing Lying Down, Leg Cramps, Palpitations, Rapid Heart Rate, Shortness of Breath and Swelling of Extremities. Female Genitourinary Not Present- Frequency, Nocturia, Painful Urination, Pelvic Pain and Urgency. Musculoskeletal Present- Joint Pain and Joint Stiffness. Not Present- Back Pain, Muscle Pain, Muscle Weakness and Swelling of Extremities. Neurological Not Present- Decreased Memory, Fainting, Headaches, Numbness, Seizures, Tingling, Tremor, Trouble walking and Weakness. Psychiatric Not Present- Anxiety, Bipolar, Change in Sleep Pattern, Depression, Fearful and Frequent crying. Hematology Not Present- Blood Thinners, Easy Bruising, Excessive bleeding, Gland problems, HIV and Persistent Infections.  Vitals (Toni Johnson CMA; 11/04/2018 11:34 AM) 11/04/2018 11:33 AM Weight: 223.13 lb Height: 70in Body Surface Area: 2.19 m Body Mass Index: 32.01 kg/m  Temp.: 98.108F(Temporal)  Pulse: 99 (Regular)  BP: 148/92 (Sitting, Right  Arm, Standard)       Physical Exam (Toni Johnson A. Magnus Ivan MD; 11/04/2018 11:54 AM) General Mental Status-Alert. General Appearance-Consistent with stated age. Hydration-Well hydrated.  Voice-Normal.  Head and Neck Head-normocephalic, atraumatic with no lesions or palpable masses.  Eye Eyeball - Bilateral-Extraocular movements intact. Sclera/Conjunctiva - Bilateral-No scleral icterus.  Chest and Lung Exam Chest and lung exam reveals -quiet, even and easy respiratory effort with no use of accessory muscles and on auscultation, normal breath sounds, no adventitious sounds and normal vocal resonance. Inspection Chest Wall - Normal. Back - normal.  Cardiovascular Cardiovascular examination reveals -on palpation PMI is normal in location and amplitude, no palpable S3 or S4. Normal cardiac borders., normal heart sounds, regular rate and rhythm with no murmurs, carotid auscultation reveals no bruits and normal pedal pulses bilaterally.  Abdomen Inspection Inspection of the abdomen reveals - No Hernias. Skin - Scar - no surgical scars. Palpation/Percussion Palpation and Percussion of the abdomen reveal - Soft, Non Tender, No Rebound tenderness, No Rigidity (guarding) and No hepatosplenomegaly. Auscultation Auscultation of the abdomen reveals - Bowel sounds normal.  Neurologic - Did not examine.  Musculoskeletal - Did not examine.    Assessment & Plan (Shaune Malacara A. Magnus Ivan MD; 11/04/2018 11:55 AM) SYMPTOMATIC CHOLELITHIASIS (K80.20) Impression: At this point, laparoscopic cholecystectomy is strongly recommended given her previous bile duct stones. I discussed the reasoning for this with her in detail. I gave her and her husband literature regarding surgery. I discussed the surgical procedure in detail. I discussed the risks of surgery which includes but is not limited to bleeding, infection, injury to surrounding structures, need to convert to an procedure,  cardiopulmonary issues, DVT, etc. Given the previous stones in the bile duct and abnormal contour to the liver, I believe anything do this at the main hospital rather than an outpatient setting

## 2018-11-18 ENCOUNTER — Other Ambulatory Visit: Payer: Self-pay

## 2018-11-18 ENCOUNTER — Encounter (HOSPITAL_COMMUNITY)
Admission: RE | Disposition: A | Discharge status: Home / Self Care | Payer: Self-pay | Source: Ambulatory Visit | Attending: Surgery

## 2018-11-18 ENCOUNTER — Ambulatory Visit (HOSPITAL_COMMUNITY): Payer: BLUE CROSS/BLUE SHIELD | Admitting: Anesthesiology

## 2018-11-18 ENCOUNTER — Ambulatory Visit (HOSPITAL_COMMUNITY)
Admission: RE | Admit: 2018-11-18 | Discharge: 2018-11-18 | Disposition: A | Discharge status: Home / Self Care | Payer: BLUE CROSS/BLUE SHIELD | Source: Ambulatory Visit | Attending: Surgery | Admitting: Surgery

## 2018-11-18 ENCOUNTER — Encounter (HOSPITAL_COMMUNITY): Payer: Self-pay | Admitting: Certified Registered Nurse Anesthetist

## 2018-11-18 DIAGNOSIS — Z888 Allergy status to other drugs, medicaments and biological substances status: Secondary | ICD-10-CM | POA: Diagnosis not present

## 2018-11-18 DIAGNOSIS — K8010 Calculus of gallbladder with chronic cholecystitis without obstruction: Secondary | ICD-10-CM | POA: Diagnosis not present

## 2018-11-18 DIAGNOSIS — Z79899 Other long term (current) drug therapy: Secondary | ICD-10-CM | POA: Diagnosis not present

## 2018-11-18 DIAGNOSIS — Z791 Long term (current) use of non-steroidal anti-inflammatories (NSAID): Secondary | ICD-10-CM | POA: Insufficient documentation

## 2018-11-18 DIAGNOSIS — I10 Essential (primary) hypertension: Secondary | ICD-10-CM | POA: Diagnosis not present

## 2018-11-18 DIAGNOSIS — G709 Myoneural disorder, unspecified: Secondary | ICD-10-CM | POA: Insufficient documentation

## 2018-11-18 DIAGNOSIS — K8020 Calculus of gallbladder without cholecystitis without obstruction: Secondary | ICD-10-CM | POA: Diagnosis present

## 2018-11-18 HISTORY — PX: CHOLECYSTECTOMY: SHX55

## 2018-11-18 SURGERY — LAPAROSCOPIC CHOLECYSTECTOMY
Anesthesia: General | Site: Abdomen

## 2018-11-18 MED ORDER — MIDAZOLAM HCL 5 MG/5ML IJ SOLN
INTRAMUSCULAR | Status: DC | PRN
  Administered 2018-11-18: 2 mg via INTRAVENOUS

## 2018-11-18 MED ORDER — FENTANYL CITRATE (PF) 250 MCG/5ML IJ SOLN
INTRAMUSCULAR | Status: DC | PRN
  Administered 2018-11-18: 100 ug via INTRAVENOUS

## 2018-11-18 MED ORDER — SODIUM CHLORIDE 0.9 % IR SOLN
Status: DC | PRN
  Administered 2018-11-18: 1000 mL

## 2018-11-18 MED ORDER — PROPOFOL 10 MG/ML IV BOLUS
INTRAVENOUS | Status: AC
  Filled 2018-11-18: qty 20

## 2018-11-18 MED ORDER — 0.9 % SODIUM CHLORIDE (POUR BTL) OPTIME
TOPICAL | Status: DC | PRN
  Administered 2018-11-18: 1000 mL

## 2018-11-18 MED ORDER — DEXAMETHASONE SODIUM PHOSPHATE 10 MG/ML IJ SOLN
INTRAMUSCULAR | Status: AC
  Filled 2018-11-18: qty 1

## 2018-11-18 MED ORDER — DEXAMETHASONE SODIUM PHOSPHATE 10 MG/ML IJ SOLN
INTRAMUSCULAR | Status: DC | PRN
  Administered 2018-11-18: 10 mg via INTRAVENOUS

## 2018-11-18 MED ORDER — PROMETHAZINE HCL 25 MG/ML IJ SOLN: 6.2500 mg | INTRAMUSCULAR | Status: DC | PRN

## 2018-11-18 MED ORDER — LIDOCAINE 2% (20 MG/ML) 5 ML SYRINGE
INTRAMUSCULAR | Status: DC | PRN
  Administered 2018-11-18: 100 mg via INTRAVENOUS

## 2018-11-18 MED ORDER — CHLORHEXIDINE GLUCONATE CLOTH 2 % EX PADS: 6.0000 | MEDICATED_PAD | Freq: Once | CUTANEOUS | Status: DC

## 2018-11-18 MED ORDER — ACETAMINOPHEN 500 MG PO TABS
1000.0000 mg | ORAL_TABLET | ORAL | Status: AC
  Administered 2018-11-18: 1000 mg via ORAL
  Filled 2018-11-18: qty 2

## 2018-11-18 MED ORDER — BUPIVACAINE-EPINEPHRINE 0.25% -1:200000 IJ SOLN
INTRAMUSCULAR | Status: DC | PRN
  Administered 2018-11-18: 20 mL

## 2018-11-18 MED ORDER — ONDANSETRON HCL 4 MG/2ML IJ SOLN
INTRAMUSCULAR | Status: DC | PRN
  Administered 2018-11-18: 4 mg via INTRAVENOUS

## 2018-11-18 MED ORDER — EPHEDRINE SULFATE-NACL 50-0.9 MG/10ML-% IV SOSY
PREFILLED_SYRINGE | INTRAVENOUS | Status: DC | PRN
  Administered 2018-11-18: 10 mg via INTRAVENOUS

## 2018-11-18 MED ORDER — MEPERIDINE HCL 50 MG/ML IJ SOLN: 6.2500 mg | INTRAMUSCULAR | Status: DC | PRN

## 2018-11-18 MED ORDER — MIDAZOLAM HCL 2 MG/2ML IJ SOLN
INTRAMUSCULAR | Status: AC
  Filled 2018-11-18: qty 2

## 2018-11-18 MED ORDER — ROCURONIUM BROMIDE 50 MG/5ML IV SOSY
PREFILLED_SYRINGE | INTRAVENOUS | Status: AC
  Filled 2018-11-18: qty 5

## 2018-11-18 MED ORDER — LACTATED RINGERS IV SOLN
INTRAVENOUS | Status: DC | PRN
  Administered 2018-11-18: 07:00:00 via INTRAVENOUS

## 2018-11-18 MED ORDER — FENTANYL CITRATE (PF) 250 MCG/5ML IJ SOLN
INTRAMUSCULAR | Status: AC
  Filled 2018-11-18: qty 5

## 2018-11-18 MED ORDER — SUGAMMADEX SODIUM 200 MG/2ML IV SOLN
INTRAVENOUS | Status: DC | PRN
  Administered 2018-11-18: 200 mg via INTRAVENOUS

## 2018-11-18 MED ORDER — CEFAZOLIN SODIUM-DEXTROSE 2-4 GM/100ML-% IV SOLN
2.0000 g | INTRAVENOUS | Status: AC
  Administered 2018-11-18: 2 g via INTRAVENOUS
  Filled 2018-11-18: qty 100

## 2018-11-18 MED ORDER — TRAMADOL HCL 50 MG PO TABS: 50.0000 mg | ORAL_TABLET | Freq: Four times a day (QID) | ORAL | 0 refills | Status: AC | PRN

## 2018-11-18 MED ORDER — GABAPENTIN 300 MG PO CAPS
300.0000 mg | ORAL_CAPSULE | ORAL | Status: AC
  Administered 2018-11-18: 300 mg via ORAL
  Filled 2018-11-18: qty 1

## 2018-11-18 MED ORDER — EPHEDRINE 5 MG/ML INJ
INTRAVENOUS | Status: AC
  Filled 2018-11-18: qty 10

## 2018-11-18 MED ORDER — PROPOFOL 10 MG/ML IV BOLUS
INTRAVENOUS | Status: DC | PRN
  Administered 2018-11-18: 110 mg via INTRAVENOUS

## 2018-11-18 MED ORDER — HYDROCODONE-ACETAMINOPHEN 7.5-325 MG PO TABS
ORAL_TABLET | ORAL | Status: AC
  Filled 2018-11-18: qty 1

## 2018-11-18 MED ORDER — ONDANSETRON HCL 4 MG/2ML IJ SOLN
INTRAMUSCULAR | Status: AC
  Filled 2018-11-18: qty 2

## 2018-11-18 MED ORDER — HYDROCODONE-ACETAMINOPHEN 7.5-325 MG PO TABS
1.0000 | ORAL_TABLET | Freq: Once | ORAL | Status: AC | PRN
  Administered 2018-11-18: 1 via ORAL

## 2018-11-18 MED ORDER — LIDOCAINE 2% (20 MG/ML) 5 ML SYRINGE
INTRAMUSCULAR | Status: AC
  Filled 2018-11-18: qty 5

## 2018-11-18 MED ORDER — ACETAMINOPHEN 10 MG/ML IV SOLN: 1000.0000 mg | Freq: Once | INTRAVENOUS | Status: DC | PRN

## 2018-11-18 MED ORDER — BUPIVACAINE-EPINEPHRINE (PF) 0.25% -1:200000 IJ SOLN
INTRAMUSCULAR | Status: AC
  Filled 2018-11-18: qty 30

## 2018-11-18 MED ORDER — HYDROMORPHONE HCL 1 MG/ML IJ SOLN: 0.2500 mg | INTRAMUSCULAR | Status: DC | PRN

## 2018-11-18 MED ORDER — ROCURONIUM BROMIDE 50 MG/5ML IV SOSY
PREFILLED_SYRINGE | INTRAVENOUS | Status: DC | PRN
  Administered 2018-11-18: 50 mg via INTRAVENOUS

## 2018-11-18 SURGICAL SUPPLY — 35 items
ADH SKN CLS APL DERMABOND .7 (GAUZE/BANDAGES/DRESSINGS) ×1
APPLIER CLIP 5 13 M/L LIGAMAX5 (MISCELLANEOUS) ×3
APR CLP MED LRG 5 ANG JAW (MISCELLANEOUS) ×1
BAG SPEC RTRVL LRG 6X4 10 (ENDOMECHANICALS) ×1
CANISTER SUCT 3000ML PPV (MISCELLANEOUS) ×3 IMPLANT
CHLORAPREP W/TINT 26ML (MISCELLANEOUS) ×3 IMPLANT
CLIP APPLIE 5 13 M/L LIGAMAX5 (MISCELLANEOUS) ×1 IMPLANT
COVER SURGICAL LIGHT HANDLE (MISCELLANEOUS) ×3 IMPLANT
COVER WAND RF STERILE (DRAPES) ×3 IMPLANT
DERMABOND ADVANCED (GAUZE/BANDAGES/DRESSINGS) ×2
DERMABOND ADVANCED .7 DNX12 (GAUZE/BANDAGES/DRESSINGS) ×1 IMPLANT
ELECT REM PT RETURN 9FT ADLT (ELECTROSURGICAL) ×3
ELECTRODE REM PT RTRN 9FT ADLT (ELECTROSURGICAL) ×1 IMPLANT
GLOVE SURG SIGNA 7.5 PF LTX (GLOVE) ×3 IMPLANT
GOWN STRL REUS W/ TWL LRG LVL3 (GOWN DISPOSABLE) ×2 IMPLANT
GOWN STRL REUS W/ TWL XL LVL3 (GOWN DISPOSABLE) ×1 IMPLANT
GOWN STRL REUS W/TWL LRG LVL3 (GOWN DISPOSABLE) ×6
GOWN STRL REUS W/TWL XL LVL3 (GOWN DISPOSABLE) ×3
KIT BASIN OR (CUSTOM PROCEDURE TRAY) ×3 IMPLANT
KIT TURNOVER KIT B (KITS) ×3 IMPLANT
NS IRRIG 1000ML POUR BTL (IV SOLUTION) ×3 IMPLANT
PAD ARMBOARD 7.5X6 YLW CONV (MISCELLANEOUS) ×3 IMPLANT
POUCH SPECIMEN RETRIEVAL 10MM (ENDOMECHANICALS) ×3 IMPLANT
SCISSORS LAP 5X35 DISP (ENDOMECHANICALS) ×3 IMPLANT
SET IRRIG TUBING LAPAROSCOPIC (IRRIGATION / IRRIGATOR) ×3 IMPLANT
SET TUBE SMOKE EVAC HIGH FLOW (TUBING) ×3 IMPLANT
SLEEVE ENDOPATH XCEL 5M (ENDOMECHANICALS) ×6 IMPLANT
SPECIMEN JAR SMALL (MISCELLANEOUS) ×3 IMPLANT
SUT MNCRL AB 4-0 PS2 18 (SUTURE) ×3 IMPLANT
TOWEL OR 17X24 6PK STRL BLUE (TOWEL DISPOSABLE) ×3 IMPLANT
TOWEL OR 17X26 10 PK STRL BLUE (TOWEL DISPOSABLE) ×3 IMPLANT
TRAY LAPAROSCOPIC MC (CUSTOM PROCEDURE TRAY) ×3 IMPLANT
TROCAR XCEL BLUNT TIP 100MML (ENDOMECHANICALS) ×3 IMPLANT
TROCAR XCEL NON-BLD 5MMX100MML (ENDOMECHANICALS) ×3 IMPLANT
WATER STERILE IRR 1000ML POUR (IV SOLUTION) ×3 IMPLANT

## 2018-11-18 NOTE — Transfer of Care (Signed)
Immediate Anesthesia Transfer of Care Note  Patient: Toni Johnson  Procedure(s) Performed: LAPAROSCOPIC CHOLECYSTECTOMY (N/A Abdomen)  Patient Location: PACU  Anesthesia Type:General  Level of Consciousness: awake and alert   Airway & Oxygen Therapy: Patient Spontanous Breathing and Patient connected to face mask oxygen  Post-op Assessment: Report given to RN, Post -op Vital signs reviewed and stable and Patient moving all extremities X 4  Post vital signs: Reviewed and stable  Last Vitals:  Vitals Value Taken Time  BP    Temp    Pulse    Resp    SpO2      Last Pain:  Vitals:   11/18/18 0641  TempSrc:   PainSc: 1       Patients Stated Pain Goal: 3 (11/18/18 0641)  Complications: No apparent anesthesia complications

## 2018-11-18 NOTE — Anesthesia Procedure Notes (Signed)

## 2018-11-18 NOTE — Anesthesia Postprocedure Evaluation (Signed)
Anesthesia Post Note  Patient: Toni Johnson  Procedure(s) Performed: LAPAROSCOPIC CHOLECYSTECTOMY (N/A Abdomen)     Patient location during evaluation: PACU Anesthesia Type: General Level of consciousness: awake and alert Pain management: pain level controlled Vital Signs Assessment: post-procedure vital signs reviewed and stable Respiratory status: spontaneous breathing, nonlabored ventilation, respiratory function stable and patient connected to nasal cannula oxygen Cardiovascular status: blood pressure returned to baseline and stable Postop Assessment: no apparent nausea or vomiting Anesthetic complications: no    Last Vitals:  Vitals:   11/18/18 0845 11/18/18 0852  BP: 134/75 128/75  Pulse: 65 66  Resp: 16 18  Temp:    SpO2: 100% 94%    Last Pain:  Vitals:   11/18/18 0852  TempSrc:   PainSc: 3                  Trevor Iha

## 2018-11-18 NOTE — Op Note (Signed)
Laparoscopic Cholecystectomy Procedure Note  Indications: This patient presents with symptomatic gallbladder disease and will undergo laparoscopic cholecystectomy.  She had preop ERCP with stone removal in Dec.  Pre-operative Diagnosis: symptomatic cholelithiasis  Post-operative Diagnosis: Same  Surgeon: Abigail Miyamoto   Assistants: 0  Anesthesia: General endotracheal anesthesia  ASA Class: 2  Procedure Details  The patient was seen again in the Holding Room. The risks, benefits, complications, treatment options, and expected outcomes were discussed with the patient. The possibilities of reaction to medication, pulmonary aspiration, perforation of viscus, bleeding, recurrent infection, finding a normal gallbladder, the need for additional procedures, failure to diagnose a condition, the possible need to convert to an open procedure, and creating a complication requiring transfusion or operation were discussed with the patient. The likelihood of improving the patient's symptoms with return to their baseline status is good.  The patient and/or family concurred with the proposed plan, giving informed consent. The site of surgery properly noted. The patient was taken to Operating Room, identified as Toni Johnson and the procedure verified as Laparoscopic Cholecystectomy with Intraoperative Cholangiogram. A Time Out was held and the above information confirmed.  Prior to the induction of general anesthesia, antibiotic prophylaxis was administered. General endotracheal anesthesia was then administered and tolerated well. After the induction, the abdomen was prepped with Chloraprep and draped in sterile fashion. The patient was positioned in the supine position.  Local anesthetic agent was injected into the skin near the umbilicus and an incision made. We dissected down to the abdominal fascia with blunt dissection.  The fascia was incised vertically and we entered the peritoneal cavity bluntly.   A pursestring suture of 0-Vicryl was placed around the fascial opening.  The Hasson cannula was inserted and secured with the stay suture.  Pneumoperitoneum was then created with CO2 and tolerated well without any adverse changes in the patient's vital signs. A 5-mm port was placed in the subxiphoid position.  Two 5-mm ports were placed in the right upper quadrant. All skin incisions were infiltrated with a local anesthetic agent before making the incision and placing the trocars.   We positioned the patient in reverse Trendelenburg, tilted slightly to the patient's left.  The gallbladder was identified, the fundus grasped and retracted cephalad. Adhesions were lysed bluntly and with the electrocautery where indicated, taking care not to injure any adjacent organs or viscus. The infundibulum was grasped and retracted laterally, exposing the peritoneum overlying the triangle of Calot. This was then divided and exposed in a blunt fashion. The cystic duct was clearly identified and bluntly dissected circumferentially. A critical view of the cystic duct and cystic artery was obtained.  The cystic duct was then ligated with clips and divided. The cystic artery was, dissected free, ligated with clips and divided as well.   The gallbladder was dissected from the liver bed in retrograde fashion with the electrocautery. The gallbladder was removed and placed in an Endocatch sac. The liver bed was irrigated and inspected. Hemostasis was achieved with the electrocautery. Copious irrigation was utilized and was repeatedly aspirated until clear.  The gallbladder and Endocatch sac were then removed through the umbilical port site.  The pursestring suture was used to close the umbilical fascia.    We again inspected the right upper quadrant for hemostasis.  Pneumoperitoneum was released as we removed the trocars.  4-0 Monocryl was used to close the skin.   Skin glue was then applied. The patient was then extubated and  brought to the recovery  room in stable condition. Instrument, sponge, and needle counts were correct at closure and at the conclusion of the case.   Findings: Mild Cholecystitis with Cholelithiasis  Estimated Blood Loss: Minimal         Drains: 0         Specimens: Gallbladder           Complications: None; patient tolerated the procedure well.         Disposition: PACU - hemodynamically stable.         Condition: stable

## 2018-11-18 NOTE — Discharge Instructions (Signed)
[facility] ______[facility] °LAPAROSCOPIC SURGERY: POST OP INSTRUCTIONS °Always review your discharge instruction sheet given to you by the facility where your surgery was performed. °IF YOU HAVE DISABILITY OR FAMILY LEAVE FORMS, YOU MUST BRING THEM TO THE OFFICE FOR PROCESSING.   °DO NOT GIVE THEM TO YOUR DOCTOR. ° °1. A prescription for pain medication may be given to you upon discharge.  Take your pain medication as prescribed, if needed.  If narcotic pain medicine is not needed, then you may take acetaminophen (Tylenol) or ibuprofen (Advil) as needed. °2. Take your usually prescribed medications unless otherwise directed. °3. If you need a refill on your pain medication, please contact your pharmacy.  They will contact our office to request authorization. Prescriptions will not be filled after 5pm or on week-ends. °4. You should follow a light diet the first few days after arrival home, such as soup and crackers, etc.  Be sure to include lots of fluids daily. °5. Most patients will experience some swelling and bruising in the area of the incisions.  Ice packs will help.  Swelling and bruising can take several days to resolve.  °6. It is common to experience some constipation if taking pain medication after surgery.  Increasing fluid intake and taking a stool softener (such as Colace) will usually help or prevent this problem from occurring.  A mild laxative (Milk of Magnesia or Miralax) should be taken according to package instructions if there are no bowel movements after 48 hours. °7. Unless discharge instructions indicate otherwise, you may remove your bandages 24-48 hours after surgery, and you may shower at that time.  You may have steri-strips (small skin tapes) in place directly over the incision.  These strips should be left on the skin for 7-10 days.  If your surgeon used skin glue on the incision, you may shower in 24 hours.  The glue will flake off over the next 2-3 weeks.  Any sutures or  staples will be removed at the office during your follow-up visit. °8. ACTIVITIES:  You may resume regular (light) daily activities beginning the next day--such as daily self-care, walking, climbing stairs--gradually increasing activities as tolerated.  You may have sexual intercourse when it is comfortable.  Refrain from any heavy lifting or straining until approved by your doctor. °a. You may drive when you are no longer taking prescription pain medication, you can comfortably wear a seatbelt, and you can safely maneuver your car and apply brakes. °b. RETURN TO WORK:  __________________________________________________________ °9. You should see your doctor in the office for a follow-up appointment approximately 2-3 weeks after your surgery.  Make sure that you call for this appointment within a day or two after you arrive home to insure a convenient appointment time. °10. OTHER INSTRUCTIONS:OK TO SHOWER STARTING TOMORROW °11. ICE PACK, TYLENOL, IBUPROFEN ALSO FOR PAIN °12. NO LIFTING MORE THAN 15 TO 20 POUNDS FOR 2 WEEKS __________________________________________________________________________________________________________________________ __________________________________________________________________________________________________________________________ °WHEN TO CALL YOUR DOCTOR: °1. Fever over 101.0 °2. Inability to urinate °3. Continued bleeding from incision. °4. Increased pain, redness, or drainage from the incision. °5. Increasing abdominal pain ° °The clinic staff is available to answer your questions during regular business hours.  Please don’t hesitate to call and ask to speak to one of the nurses for clinical concerns.  If you have a medical emergency, go to the nearest emergency room or call 911.  A surgeon from [facility] is always on call at the hospital. °[address], [address], [place], [place]  [address] ? [address] [address], [place], [place]   [address] °[phone] ?  [phone] ? FAX [phone] °Web site: [email or web address] °

## 2018-11-18 NOTE — Interval H&P Note (Signed)
History and Physical Interval Note:no change in H and P  11/18/2018 7:05 AM  Toni Johnson  has presented today for surgery, with the diagnosis of SYMPTOMATIC GALLSTONES  The various methods of treatment have been discussed with the patient and family. After consideration of risks, benefits and other options for treatment, the patient has consented to  Procedure(s): LAPAROSCOPIC CHOLECYSTECTOMY (N/A) as a surgical intervention .  The patient's history has been reviewed, patient examined, no change in status, stable for surgery.  I have reviewed the patient's chart and labs.  Questions were answered to the patient's satisfaction.     Abigail Miyamoto

## 2018-11-19 ENCOUNTER — Encounter (HOSPITAL_COMMUNITY): Payer: Self-pay | Admitting: Surgery

## 2019-08-19 ENCOUNTER — Other Ambulatory Visit: Payer: Self-pay

## 2019-08-19 ENCOUNTER — Emergency Department (HOSPITAL_COMMUNITY): Payer: Worker's Compensation

## 2019-08-19 ENCOUNTER — Encounter (HOSPITAL_COMMUNITY): Payer: Self-pay

## 2019-08-19 ENCOUNTER — Emergency Department (HOSPITAL_COMMUNITY)
Admission: EM | Admit: 2019-08-19 | Discharge: 2019-08-19 | Disposition: A | Discharge status: Home / Self Care | Payer: Worker's Compensation | Attending: Emergency Medicine | Admitting: Emergency Medicine

## 2019-08-19 DIAGNOSIS — W010XXA Fall on same level from slipping, tripping and stumbling without subsequent striking against object, initial encounter: Secondary | ICD-10-CM | POA: Diagnosis not present

## 2019-08-19 DIAGNOSIS — Y990 Civilian activity done for income or pay: Secondary | ICD-10-CM | POA: Insufficient documentation

## 2019-08-19 DIAGNOSIS — I10 Essential (primary) hypertension: Secondary | ICD-10-CM | POA: Diagnosis not present

## 2019-08-19 DIAGNOSIS — Y9389 Activity, other specified: Secondary | ICD-10-CM | POA: Insufficient documentation

## 2019-08-19 DIAGNOSIS — Y9289 Other specified places as the place of occurrence of the external cause: Secondary | ICD-10-CM | POA: Diagnosis not present

## 2019-08-19 DIAGNOSIS — Z79899 Other long term (current) drug therapy: Secondary | ICD-10-CM | POA: Diagnosis not present

## 2019-08-19 DIAGNOSIS — S42202A Unspecified fracture of upper end of left humerus, initial encounter for closed fracture: Secondary | ICD-10-CM

## 2019-08-19 DIAGNOSIS — S42212A Unspecified displaced fracture of surgical neck of left humerus, initial encounter for closed fracture: Secondary | ICD-10-CM | POA: Diagnosis not present

## 2019-08-19 DIAGNOSIS — S4992XA Unspecified injury of left shoulder and upper arm, initial encounter: Secondary | ICD-10-CM | POA: Diagnosis present

## 2019-08-19 DIAGNOSIS — R220 Localized swelling, mass and lump, head: Secondary | ICD-10-CM | POA: Diagnosis not present

## 2019-08-19 MED ORDER — ONDANSETRON HCL 4 MG/2ML IJ SOLN
4.0000 mg | Freq: Once | INTRAMUSCULAR | Status: AC
  Administered 2019-08-19: 4 mg via INTRAVENOUS
  Filled 2019-08-19: qty 2

## 2019-08-19 MED ORDER — MORPHINE SULFATE (PF) 4 MG/ML IV SOLN
4.0000 mg | Freq: Once | INTRAVENOUS | Status: AC
  Administered 2019-08-19: 4 mg via INTRAVENOUS
  Filled 2019-08-19: qty 1

## 2019-08-19 NOTE — ED Triage Notes (Signed)
Pt arrived via EMS from work, pt tripped and fell at work, and fell in left arm. Pt denies and LOC. Left upper arm pain, crepitus, + PMS, and deformities to area..   100 mcg of fentanyl given 20 G rt AC

## 2019-08-19 NOTE — ED Provider Notes (Signed)
[facility]-EMERGENCY DEPT Provider Note   CSN: [id number] Arrival date & time: 08/19/19  1621     History   Chief Complaint Fall, left arm pain  HPI Toni Johnson is a 68 y.o. female with past medical history significant for hypertension, osteopenia, vitamin D deficiency presents to emergency department today with chief complaint of fall and left arm pain. Onset was 1 hour prior to arrival. Pt states she was at work carrying cups in each hand when she tripped on her shoe and fell forward. She landed on her left shoulder. She is reporting constant sharp pain in left shoulder radiating down her arm, reports pain is 8/10 in severity. Pain is worse with movement. She hit her lip when she fell. She denies LOC. Her last PO intake was a sandwich at 1pm today  She received 100 mcg fentanyl from EMS with improvement of pain. She denies fever chills, headache, neck pain, visual changes, numbness, weakness, tingling. She is not anticoagulated.   Past Medical History:  Diagnosis Date  . Arthritis    hands, knees, and feet bilaterally  . Dilated aortic root (HCC)   . Dysmenorrhea   . Hypertension   . 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 Osteopenia 2001  . Postmenopausal state 1999   HRT X 2 years.  Fosamax started Dec. 2017 due to FRAX risk.  . Vitamin D deficiency disease 2009    There are no active problems to display for this patient.   Past Surgical History:  Procedure Laterality Date  . AUGMENTATION MAMMAPLASTY Bilateral 05/1982  . CHOLECYSTECTOMY N/A 11/18/2018   Procedure: LAPAROSCOPIC CHOLECYSTECTOMY;  Surgeon: Abigail Miyamoto, MD;  Location: [facility] OR;  Service: General;  Laterality: N/A;  . COLONOSCOPY    . CRYOTHERAPY  1993   CIN I  . ENDOSCOPIC RETROGRADE CHOLANGIOPANCREATOGRAPHY (ERCP) WITH PROPOFOL N/A 10/17/2018   Procedure: ENDOSCOPIC RETROGRADE CHOLANGIOPANCREATOGRAPHY (ERCP) WITH PROPOFOL;  Surgeon: Jeani Hawking, MD;   Location: [facility] ENDOSCOPY;  Service: Endoscopy;  Laterality: N/A;  . REMOVAL OF STONES  10/17/2018   Procedure: REMOVAL OF STONES;  Surgeon: Jeani Hawking, MD;  Location: [facility] ENDOSCOPY;  Service: Endoscopy;;  . Dennison Mascot  10/17/2018   Procedure: Dennison Mascot;  Surgeon: Jeani Hawking, MD;  Location: [facility] ENDOSCOPY;  Service: Endoscopy;;     OB History    Gravida  0   Para  0   Term  0   Preterm  0   AB  0   Living  0     SAB  0   TAB  0   Ectopic  0   Multiple  0   Live Births  0            Home Medications    Prior to Admission medications   Medication Sig Start Date End Date Taking? Authorizing Provider  Calcium Carbonate-Vitamin D (CALCIUM + D PO) Take 1 tablet by mouth 2 (two) times daily.    Yes [provider]  CINNAMON PO Take 1 capsule by mouth 2 (two) times daily.    Yes [provider]  DULoxetine (CYMBALTA) 60 MG capsule Take 60 mg by mouth daily.   Yes [provider]  fish oil-omega-3 fatty acids 1000 MG capsule Take 1 g by mouth 2 (two) times daily.    Yes [provider]  Gabapentin, Once-Daily, (GRALISE) 600 MG TABS Take 1,800 mg by mouth every evening.   Yes [provider]  lidocaine (LIDODERM) 5 %  Place 1 patch onto the skin 3 (three) times daily as needed (pain).   Yes [provider]  lisinopril (PRINIVIL,ZESTRIL) 5 MG tablet Take 5 mg by mouth daily.   Yes [provider]  meloxicam (MOBIC) 15 MG tablet Take 15 mg by mouth daily.  05/15/16  Yes [provider]  Misc Natural Products (OSTEO BI-FLEX ADV JOINT SHIELD PO) Take 2 tablets by mouth daily.   Yes [provider]  Multiple Vitamins-Minerals (MULTIVITAMIN PO) Take 1 tablet by mouth daily.    Yes [provider]  Red Yeast Rice 600 MG CAPS Take 1 capsule by mouth 2 (two) times daily.   Yes [provider]  tapentadol (NUCYNTA ER) 100 MG 12 hr tablet Take 100 mg by mouth every 12 (twelve)  hours.   Yes [provider]  traMADol (ULTRAM) 50 MG tablet Take 1-2 tablets (50-100 mg total) by mouth every 6 (six) hours as needed. Patient not taking: Reported on 08/19/2019 11/18/18   Abigail Miyamoto, MD    Family History Family History  Problem Relation Age of Onset  . Hypertension Mother   . Osteoporosis Mother   . Osteoarthritis Mother   . Hypertension Father   . Diabetes Father   . Heart failure Father   . Diabetes Sister   . Colitis Sister   . Osteoarthritis Sister   . Heart disease Brother   . COPD Brother   . Lung cancer Brother   . Colitis Brother   . Osteoporosis Maternal Aunt   . Breast cancer Other 32  . Colitis Other        colostomy  . Lupus Other     Social History Social History   Tobacco Use  . Smoking status: Never Smoker  . Smokeless tobacco: Never Used  Substance Use Topics  . Alcohol use: No  . Drug use: No     Allergies   Hydrocodone and Niacin and related   Review of Systems Review of Systems  Constitutional: Negative for chills and fever.  HENT: Positive for facial swelling. Negative for congestion, ear discharge, ear pain, sinus pressure, sinus pain and sore throat.   Eyes: Negative for pain and redness.  Respiratory: Negative for cough and shortness of breath.   Cardiovascular: Negative for chest pain.  Gastrointestinal: Negative for abdominal pain, constipation, diarrhea, nausea and vomiting.  Genitourinary: Negative for dysuria and hematuria.  Musculoskeletal: Positive for arthralgias, joint swelling and myalgias. Negative for back pain and neck pain.  Skin: Positive for wound.  Neurological: Negative for weakness, numbness and headaches.     Physical Exam Updated Vital Signs BP 137/85 (BP Location: Right Arm)   Pulse 72   Temp 97.7 F (36.5 C) (Oral)   Resp 15   LMP 10/29/1997 (Approximate)   SpO2 100%   Physical Exam Vitals signs and nursing note reviewed.  Constitutional:      General: She is not in  acute distress.    Appearance: She is not ill-appearing.  HENT:     Head: Normocephalic. No raccoon eyes or Battle's sign.     Jaw: There is normal jaw occlusion.     Comments: No tenderness to palpation of skull. No deformities or crepitus noted. No open wounds, abrasions or lacerations.  Left upper lip is swollen without laceration.No loose teeth.    Right Ear: Tympanic membrane normal. No hemotympanum.     Left Ear: Tympanic membrane and external ear normal. No hemotympanum.     Nose: Nose normal.  No nasal deformity, signs of injury or nasal tenderness.     Right Nostril: No septal hematoma.     Left Nostril: No septal hematoma.     Mouth/Throat:     Mouth: Mucous membranes are moist.     Pharynx: Oropharynx is clear.  Eyes:     General: No scleral icterus.       Right eye: No discharge.        Left eye: No discharge.     Extraocular Movements: Extraocular movements intact.     Conjunctiva/sclera: Conjunctivae normal.     Pupils: Pupils are equal, round, and reactive to light.  Neck:     Musculoskeletal: Normal range of motion.     Vascular: No JVD.     Comments: Full ROM intact without spinous process TTP. No bony stepoffs or deformities, no paraspinous muscle TTP or muscle spasms.  No bruising, erythema, or swelling.  Cardiovascular:     Rate and Rhythm: Normal rate and regular rhythm.     Pulses: Normal pulses.          Radial pulses are 2+ on the right side and 2+ on the left side.     Heart sounds: Normal heart sounds.  Pulmonary:     Comments: Lungs clear to auscultation in all fields. Symmetric chest rise. No wheezing, rales, or rhonchi. Abdominal:     Comments: Abdomen is soft, non-distended, and non-tender in all quadrants. No rigidity, no guarding. No peritoneal signs.  Musculoskeletal: Normal range of motion.     Comments: Left shoulder with decrease ROM secondary to pain. Neurovascularly intact distally. Compartments soft above and below affected joint. No open  wound. Crepitus felt when attempting ROM.  Full ROM left wrist. Decreased ROM of left elbow secondary to shoulder pain. No bony tenderness of elbow. Grip strength is 5/5 and equal in bilateral upper extremities.  Radial pulse is 2+ bilaterally. Able to wiggle all fingers, left arm is warm to the touch.  Pelvis is stable. Full ROM of bilateral lower extremities.  Skin:    General: Skin is warm and dry.     Capillary Refill: Capillary refill takes less than 2 seconds.  Neurological:     Mental Status: She is oriented to person, place, and time.     GCS: GCS eye subscore is 4. GCS verbal subscore is 5. GCS motor subscore is 6.     Comments: Fluent speech, no facial droop. Speech is clear and goal oriented, follows commands CN III-XII intact, no facial droop   Psychiatric:        Behavior: Behavior normal.      ED Treatments / Results  Labs (all labs ordered are listed, but only abnormal results are displayed) Labs Reviewed - No data to display  EKG None  Radiology Dg Elbow 2 Views Left  Result Date: 08/19/2019 CLINICAL DATA:  Tripped and fell. Left elbow injury and pain. Initial encounter. EXAM: LEFT ELBOW - 2 VIEW COMPARISON:  None. FINDINGS: No evidence of fracture or dislocation. No definite evidence of elbow joint effusion. No other osseous abnormality identified. IMPRESSION: Negative. Electronically Signed   By: Marlaine Hind M.D.   On: 08/19/2019 18:30   Dg Shoulder Left  Result Date: 08/19/2019 CLINICAL DATA:  Tripped and fell. Left shoulder injury and pain. Initial encounter. EXAM: LEFT SHOULDER - 2+ VIEW COMPARISON:  None. FINDINGS: Nondisplaced fracture of the left humeral neck is seen. No evidence of dislocation or other osseous abnormality. IMPRESSION: Nondisplaced left humeral neck  fracture. Electronically Signed   By: Danae OrleansJohn A Stahl M.D.   On: 08/19/2019 18:29   Dg Humerus Left  Result Date: 08/19/2019 CLINICAL DATA:  Slipped and fell. Left upper arm and shoulder  pain. Initial encounter. EXAM: LEFT HUMERUS - 2+ VIEW COMPARISON:  None. FINDINGS: Mildly displaced fracture through the humeral neck is seen. No other fractures identified. IMPRESSION: Mildly displaced humeral neck fracture. Electronically Signed   By: Danae OrleansJohn A Stahl M.D.   On: 08/19/2019 18:31    Procedures Procedures (including critical care time)  Medications Ordered in ED Medications  morphine 4 MG/ML injection 4 mg (4 mg Intravenous Given 08/19/19 1838)  ondansetron (ZOFRAN) injection 4 mg (4 mg Intravenous Given 08/19/19 1838)     Initial Impression / Assessment and Plan / ED Course  I have reviewed the triage vital signs and the nursing notes.  Pertinent labs & imaging results that were available during my care of the patient were reviewed by me and considered in my medical decision making (see chart for details).  Patient seen and examined. Patient nontoxic appearing, in no apparent distress, vitals WNL. She arrives via EMS with splint on left arm. She has decreased ROM and likely shoulder dislocation. She is neurovascularly intact distally. Compartments soft above and below affected joint.  She has a swollen lip, but not other signs of head injury. CN 2-12 are intact. She is able to give detailed description of events leading up to and after her fall. Very low suspicion for head injury.   Xray left shoulder, elbow and humerus viewed by myself and ED attending. Findings are mildly displaced humeral neck fracture. Will apply sling and attempt pain control. Pt sees pain clinic and has prescription for Nucynta or chronic pain. Will not discharge home with additional narcotic prescription. I have reviewed the PDMP during this encounter.    Patient care transferred to attending Dr. Criss AlvineGoldston at the end of my shift. He will reassess patient after pain medication. Anticipate discharge home with outpatient ortho follow up. This is shared ED visit with attending Dr. Criss AlvineGoldston.   Portions of  this note were generated with Scientist, clinical (histocompatibility and immunogenetics)Dragon dictation software. Dictation errors may occur despite best attempts at proofreading.    Final Clinical Impressions(s) / ED Diagnoses   Final diagnoses:  Closed fracture of proximal end of left humerus, unspecified fracture morphology, initial encounter    ED Discharge Orders    None       Kathyrn Lasslbrizze, Chemere Steffler E, PA-C 08/19/19 1854    Pricilla LovelessGoldston, Scott, MD 08/19/19 1935

## 2019-12-24 ENCOUNTER — Ambulatory Visit: Payer: Self-pay | Attending: Internal Medicine

## 2019-12-24 DIAGNOSIS — Z23 Encounter for immunization: Secondary | ICD-10-CM | POA: Insufficient documentation

## 2019-12-24 NOTE — Progress Notes (Signed)
   Covid-19 Vaccination Clinic  Name:  JONAYA FRESHOUR    MRN: [id number] DOB: [birth year]  12/24/2019  Ms. Linden was observed post Covid-19 immunization for 15 minutes without incidence. She was provided with Vaccine Information Sheet and instruction to access the V-Safe system.   Ms. Hawks was instructed to call 911 with any severe reactions post vaccine: Marland Kitchen Difficulty breathing  . Swelling of your face and throat  . A fast heartbeat  . A bad rash all over your body  . Dizziness and weakness    Immunizations Administered    Name Date Dose VIS Date Route   Pfizer COVID-19 Vaccine 12/24/2019 11:02 AM 0.3 mL 10/09/2019 Intramuscular   Manufacturer: [employer], [employer]   Lot: [id number]   NDC: [address]-7

## 2020-01-20 ENCOUNTER — Ambulatory Visit: Payer: Self-pay | Attending: Internal Medicine

## 2020-01-20 DIAGNOSIS — Z23 Encounter for immunization: Secondary | ICD-10-CM

## 2020-01-20 NOTE — Progress Notes (Signed)
   Covid-19 Vaccination Clinic  Name:  Toni Johnson    MRN: [id number] DOB: Mar 05, 1951  01/20/2020  Toni Johnson was observed post Covid-19 immunization for 15 minutes without incident. She was provided with Vaccine Information Sheet and instruction to access the V-Safe system.   Toni Johnson was instructed to call 911 with any severe reactions post vaccine: Marland Kitchen Difficulty breathing  . Swelling of face and throat  . A fast heartbeat  . A bad rash all over body  . Dizziness and weakness   Immunizations Administered    Name Date Dose VIS Date Route   Pfizer COVID-19 Vaccine 01/20/2020  9:45 AM 0.3 mL 10/09/2019 Intramuscular   Manufacturer: [employer], [employer]   Lot: [id number]   NDC: [address]-5

## 2020-10-06 ENCOUNTER — Other Ambulatory Visit: Payer: Self-pay | Admitting: Orthopedic Surgery

## 2020-10-06 DIAGNOSIS — M25311 Other instability, right shoulder: Secondary | ICD-10-CM

## 2020-10-25 ENCOUNTER — Other Ambulatory Visit: Payer: Self-pay

## 2020-11-03 ENCOUNTER — Ambulatory Visit
Admission: RE | Admit: 2020-11-03 | Discharge: 2020-11-03 | Disposition: A | Discharge status: Home / Self Care | Payer: BC Managed Care – PPO | Source: Ambulatory Visit | Attending: Orthopedic Surgery | Admitting: Orthopedic Surgery

## 2020-11-03 DIAGNOSIS — M25311 Other instability, right shoulder: Secondary | ICD-10-CM

## 2021-03-14 ENCOUNTER — Other Ambulatory Visit: Payer: Self-pay | Admitting: Family Medicine

## 2021-03-14 DIAGNOSIS — I7090 Unspecified atherosclerosis: Secondary | ICD-10-CM

## 2021-04-06 ENCOUNTER — Ambulatory Visit
Admission: RE | Admit: 2021-04-06 | Discharge: 2021-04-06 | Disposition: A | Discharge status: Home / Self Care | Payer: BC Managed Care – PPO | Source: Ambulatory Visit | Attending: Family Medicine | Admitting: Family Medicine

## 2021-04-06 ENCOUNTER — Other Ambulatory Visit: Payer: Self-pay

## 2021-04-06 DIAGNOSIS — I7090 Unspecified atherosclerosis: Secondary | ICD-10-CM

## 2021-06-22 ENCOUNTER — Telehealth: Payer: Self-pay | Admitting: *Deleted

## 2021-06-22 NOTE — Telephone Encounter (Deleted)
[facility] called for PT POC 1wk1, 2wk4,1wk2,  Approval given.

## 2021-06-27 NOTE — Telephone Encounter (Signed)
ERROR

## 2023-06-05 IMAGING — US US AORTA
1 series · 14 of 25 positions shown · non-contrast
Comparison: None.

CLINICAL DATA: Aortic atherosclerotic disease

EXAM:
ULTRASOUND OF ABDOMINAL AORTA
TECHNIQUE: Ultrasound examination of the abdominal aorta and proximal common
iliac arteries was performed to evaluate for aneurysm. Additional
color and Doppler images of the distal aorta were obtained to
document patency.

[Series 1: us aorta · 0.26mm/px · 14 of 27 slices shown]
[im 1/27]
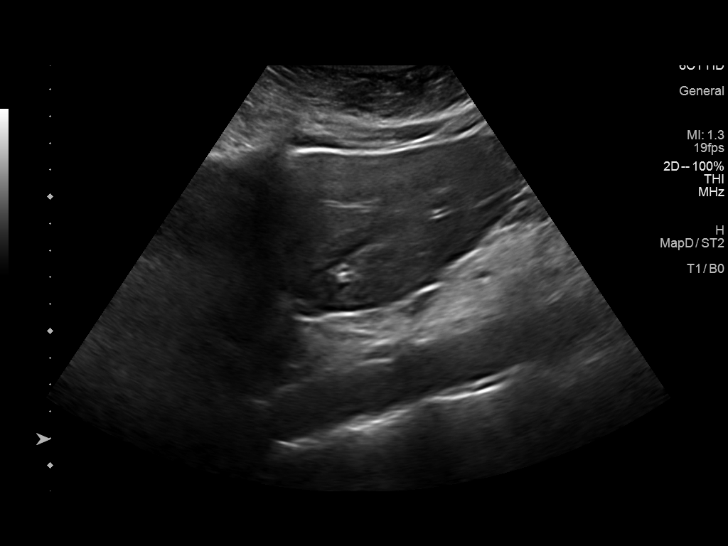
[im 3/27]
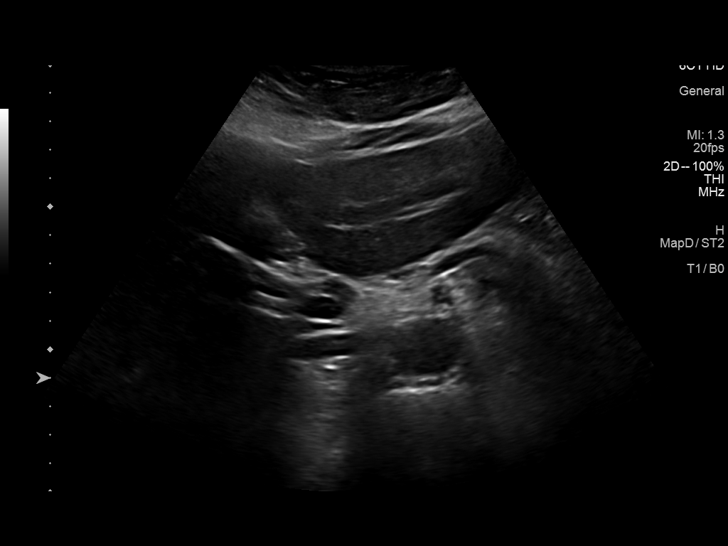
[im 5/27]
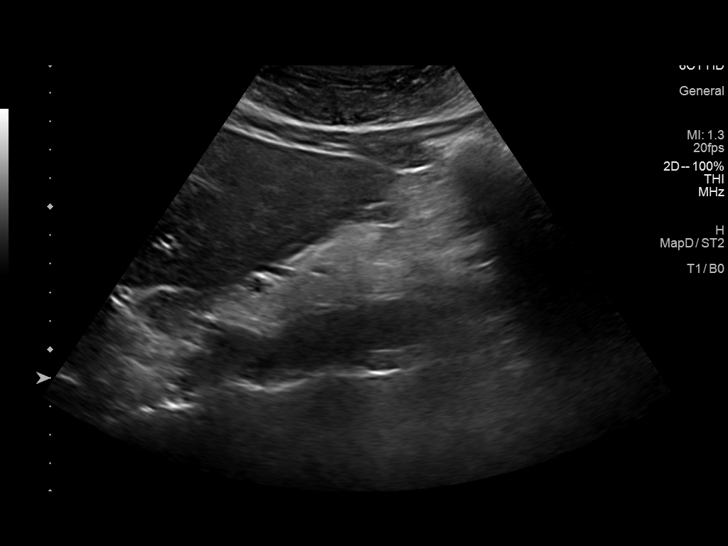
[im 7/27]
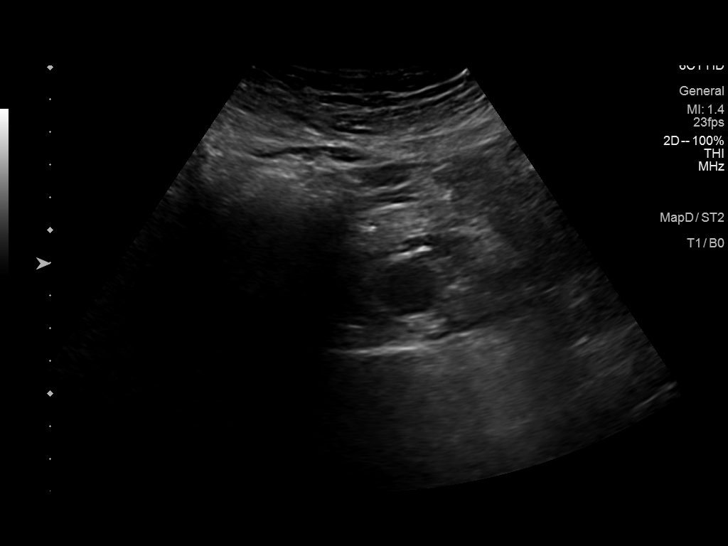
[im 9/27]
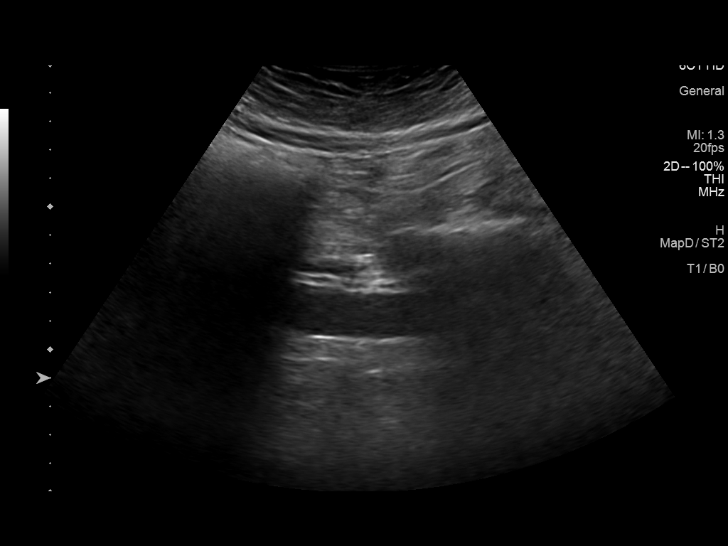
[im 10/27]
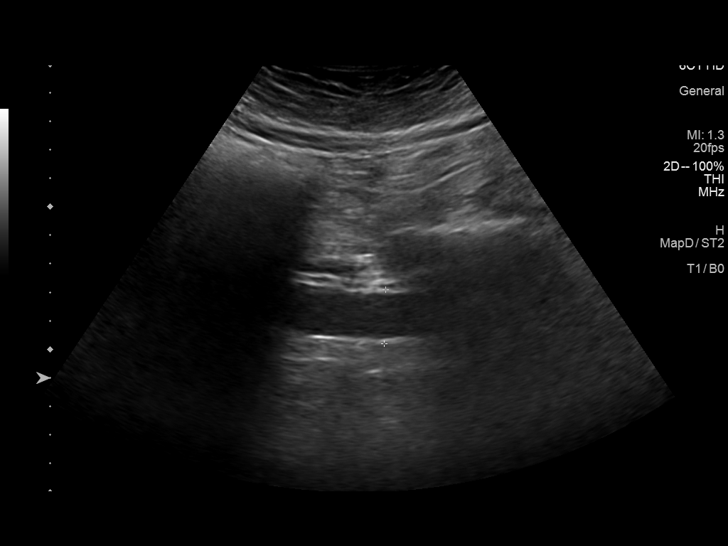
[im 12/27]
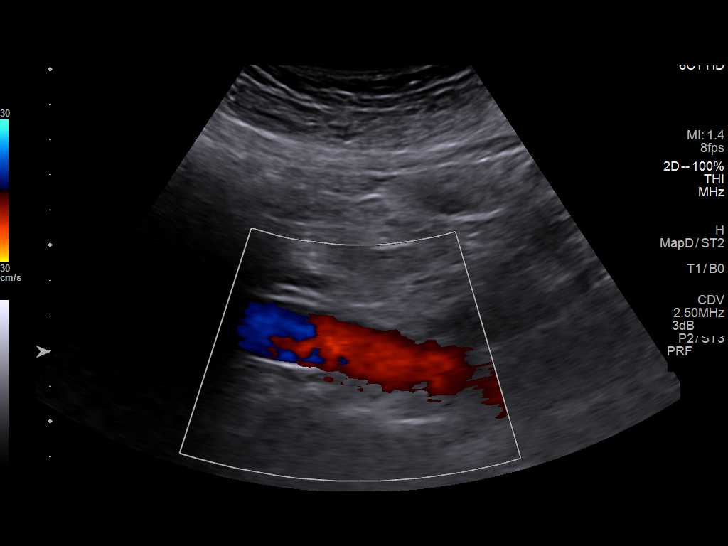
[im 15/27]
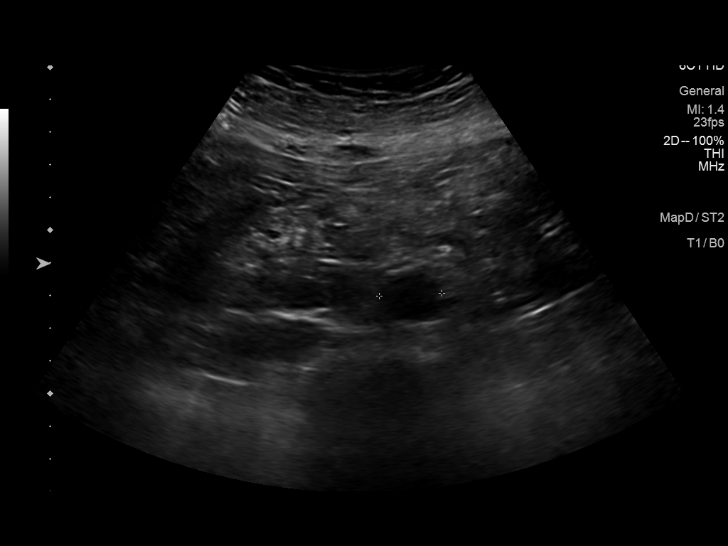
[im 17/27]
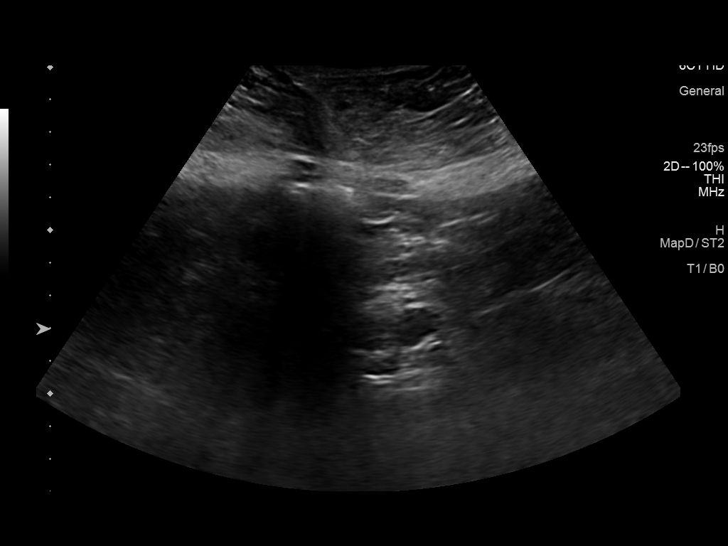
[im 18/27]
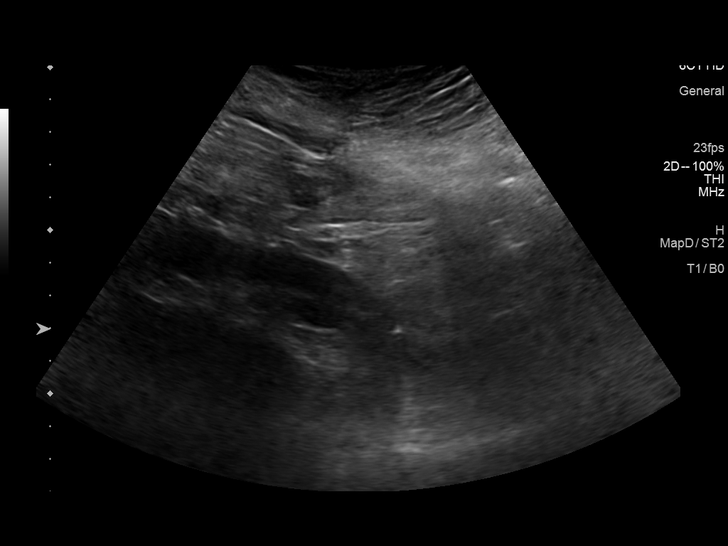
[im 20/27]
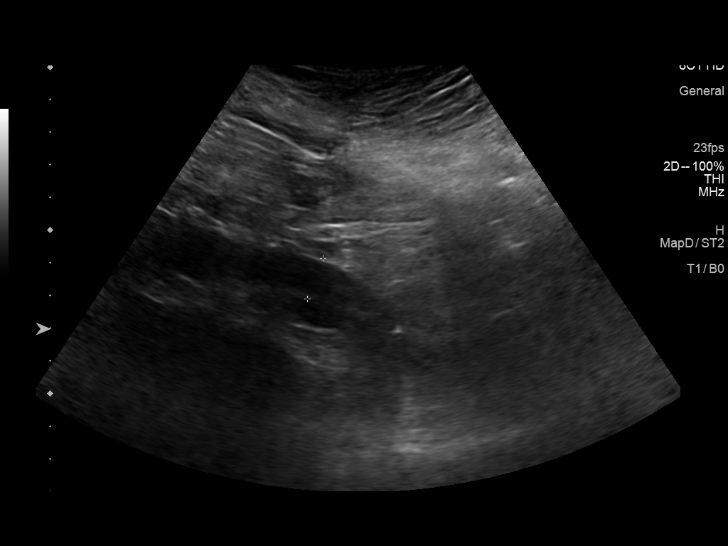
[im 22/27]
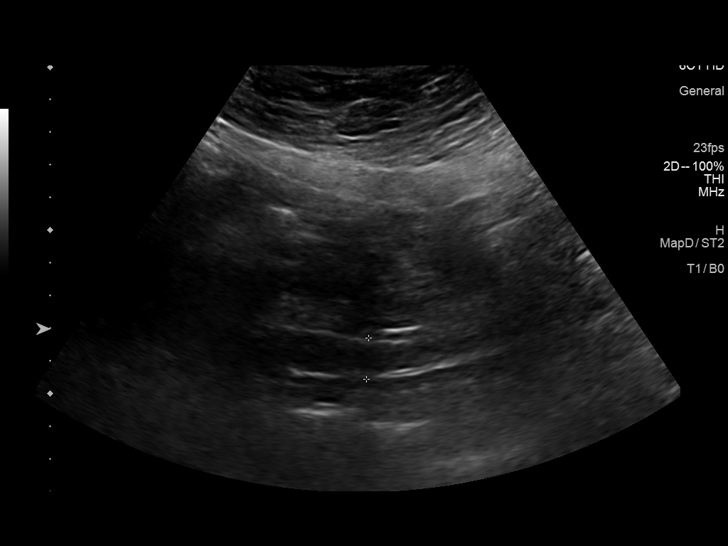
[im 24/27]
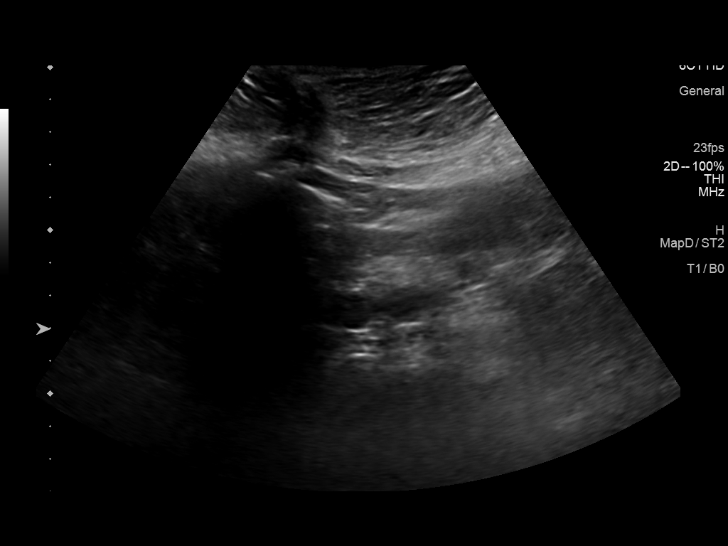
[im 27/27]
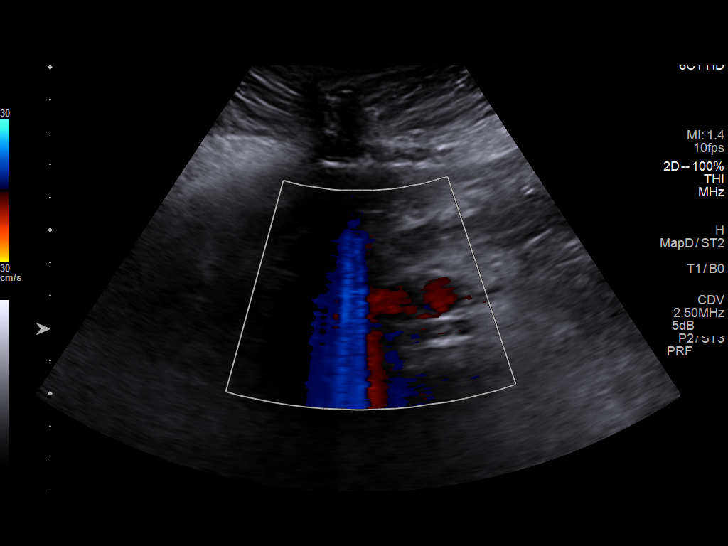

[14 of 25 positions shown; findings below may reference images not displayed]

FINDINGS: Abdominal aortic measurements as follows:

Proximal:  2.5 x 2.6 cm

Mid:  2.0 x 1.9 cm

Distal:  1.9 x 1.9 cm
Patent: Yes, peak systolic velocity is 75 cm/s

Right common iliac artery: 1.3 x 1.4 cm

Left common iliac artery: 1.3 x 1.4 cm
IMPRESSION: No abdominal aortic aneurysm identified.

## 2024-06-17 ENCOUNTER — Other Ambulatory Visit (HOSPITAL_COMMUNITY): Payer: Self-pay | Admitting: Family Medicine

## 2024-06-17 DIAGNOSIS — I350 Nonrheumatic aortic (valve) stenosis: Secondary | ICD-10-CM

## 2024-07-21 ENCOUNTER — Ambulatory Visit (HOSPITAL_COMMUNITY)
Admission: RE | Admit: 2024-07-21 | Discharge: 2024-07-21 | Disposition: A | Discharge status: Home / Self Care | Source: Ambulatory Visit | Attending: Internal Medicine | Admitting: Internal Medicine

## 2024-07-21 DIAGNOSIS — R011 Cardiac murmur, unspecified: Secondary | ICD-10-CM | POA: Diagnosis not present

## 2024-07-21 DIAGNOSIS — I350 Nonrheumatic aortic (valve) stenosis: Secondary | ICD-10-CM | POA: Insufficient documentation

## 2024-07-21 LAB — ECHOCARDIOGRAM COMPLETE
Area-P 1/2: 3.91 cm2
MV VTI: 1.74 cm2
S' Lateral: 3.2 cm

## 2024-11-15 NOTE — Progress Notes (Unsigned)
 "   No chief complaint on file.  History of Present Illness: 74 yo female with history of HTN and mitral stenosis who is here today as a new patient for discussion of her valve disease. Echo September 2025 with LVEF=60-65%. Normal RV function. Trivial mitral regurgitation. Mild mitral stenosis with severe MAD. Aortic valve sclerosis.   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  Fosamax  started Dec. 2017 due to FRAX risk.   Vitamin D  deficiency disease 2009    Past Surgical History:  Procedure Laterality Date   AUGMENTATION MAMMAPLASTY Bilateral 05/1982   CHOLECYSTECTOMY N/A 11/18/2018   Procedure: LAPAROSCOPIC CHOLECYSTECTOMY;  Surgeon: Vernetta Berg, MD;  Location: [facility] OR;  Service: General;  Laterality: N/A;   COLONOSCOPY     CRYOTHERAPY  1993   CIN I   ENDOSCOPIC RETROGRADE CHOLANGIOPANCREATOGRAPHY (ERCP) WITH PROPOFOL  N/A 10/17/2018   Procedure: ENDOSCOPIC RETROGRADE CHOLANGIOPANCREATOGRAPHY (ERCP) WITH PROPOFOL ;  Surgeon: Rollin Dover, MD;  Location: [facility];  Service: Endoscopy;  Laterality: N/A;   REMOVAL OF STONES  10/17/2018   Procedure: REMOVAL OF STONES;  Surgeon: Rollin Dover, MD;  Location: [facility];  Service: Endoscopy;;   SPHINCTEROTOMY  10/17/2018   Procedure: ANNETT;  Surgeon: Rollin Dover, MD;  Location: [facility];  Service: Endoscopy;;    Current Outpatient Medications  Medication Sig Dispense Refill   Calcium Carbonate-Vitamin D  (CALCIUM + D PO) Take 1 tablet by mouth 2 (two) times daily.      CINNAMON PO Take 1 capsule by mouth 2 (two) times daily.      DULoxetine (CYMBALTA) 60 MG capsule Take 60 mg by mouth daily.     fish  oil-omega-3 fatty acids 1000 MG capsule Take 1 g by mouth 2 (two) times daily.      Gabapentin , Once-Daily, (GRALISE ) 600 MG TABS Take 1,800 mg by mouth every evening.     lidocaine  (LIDODERM ) 5 % Place 1 patch onto the skin 3 (three) times daily as needed (pain).     lisinopril (PRINIVIL,ZESTRIL) 5 MG tablet Take 5 mg by mouth daily.     meloxicam (MOBIC) 15 MG tablet Take 15 mg by mouth daily.   2   Misc Natural Products (OSTEO BI-FLEX ADV JOINT SHIELD PO) Take 2 tablets by mouth daily.     Multiple Vitamins-Minerals (MULTIVITAMIN PO) Take 1 tablet by mouth daily.      Red Yeast Rice 600 MG CAPS Take 1 capsule by mouth 2 (two) times daily.     tapentadol (NUCYNTA ER) 100 MG 12 hr tablet Take 100 mg by mouth every 12 (twelve) hours.     traMADol  (ULTRAM ) 50 MG tablet Take 1-2 tablets (50-100 mg total) by mouth every 6 (six) hours as needed. (Patient not taking: Reported on 08/19/2019) 25 tablet 0   No current facility-administered medications for this visit.    Allergies[1]  Social History   Socioeconomic History   Marital status: Married    Spouse name: Not on file   Number of children: Not on file   Years of education: Not on file   Highest education level: Not on file  Occupational History   Not on file  Tobacco Use   Smoking status: Never   Smokeless tobacco: Never  Vaping Use   Vaping status: Never Used  Substance and Sexual Activity   Alcohol use: No   Drug use: No   Sexual activity: Never    Birth control/protection: None  Other Topics Concern   Not on file  Social History Narrative   Not on file   Social Drivers of Health   Tobacco Use: Not on file  Financial Resource Strain: Not on file  Food Insecurity: Not on file  Transportation Needs: Not on file  Physical Activity: Not on file  Stress: Not on file  Social Connections: Not on file  Intimate Partner Violence: Not on file  Depression (PHQ2-9): Not on file  Alcohol Screen: Not on file  Housing: Not on  file  Utilities: Not on file  Health Literacy: Not on file    Family History  Problem Relation Age of Onset   Hypertension Mother    Osteoporosis Mother    Osteoarthritis Mother    Hypertension Father    Diabetes Father    Heart failure Father    Diabetes Sister    Colitis Sister    Osteoarthritis Sister    Heart disease Brother    COPD Brother    Lung cancer Brother    Colitis Brother    Osteoporosis Maternal Aunt    Breast cancer Other 32   Colitis Other        colostomy   Lupus Other     Review of Systems:  As stated in the HPI and otherwise negative.   LMP 10/29/1997   Physical Examination: General: Well developed, well nourished, NAD  HEENT: OP clear, mucus membranes moist  SKIN: warm, dry. No rashes. Neuro: No focal deficits  Musculoskeletal: Muscle strength 5/5 all ext  Psychiatric: Mood and affect normal  Neck: No JVD, no carotid bruits, no thyromegaly, no lymphadenopathy.  Lungs:Clear bilaterally, no wheezes, rhonci, crackles Cardiovascular: Regular rate and rhythm. No murmurs, gallops or rubs. Abdomen:Soft. Bowel sounds present. Non-tender.  Extremities: No lower extremity edema. Pulses are 2 + in the bilateral DP/PT.  EKG:  EKG {ACTION; IS/IS [id number]:[id number]} ordered today. The ekg ordered today demonstrates ***  Recent Labs: No results found for requested labs within last 365 days.   Lipid Panel    Component Value Date/Time   CHOL 210 (H) 06/15/2016 1024   TRIG 71 06/15/2016 1024   HDL 74 06/15/2016 1024   CHOLHDL 2.8 06/15/2016 1024   VLDL 14 06/15/2016 1024   LDLCALC 122 06/15/2016 1024     Wt Readings from Last 3 Encounters:  11/18/18 221 lb 11.2 oz (100.6 kg)  11/12/18 221 lb 11.2 oz (100.6 kg)  10/17/18 225 lb (102.1 kg)    Assessment and Plan:   1. Mitral stenosis: Mild by echo in September 2025. Will repeat echo in September 2026.   Labs/ tests ordered today include:  No orders of the defined types were placed in this  encounter.  Disposition:   F/U with me in ***   Signed, Lonni Cash, MD, [facility] 11/15/2024 5:25 PM    [facility] [address] [place], [place], [place]  [address] Phone: [phone]; Fax: [phone]       [1]  Allergies Allergen Reactions   Hydrocodone  Nausea And Vomiting   Niacin And Related Other (See Comments)    Redness and flushing   "

## 2024-11-16 ENCOUNTER — Ambulatory Visit: Admitting: Cardiovascular Disease

## 2024-11-16 ENCOUNTER — Encounter: Payer: Self-pay | Admitting: Cardiovascular Disease

## 2024-11-16 VITALS — BP 116/60 | HR 76 | Ht 66.0 in | Wt 245.2 lb

## 2024-11-16 DIAGNOSIS — I342 Nonrheumatic mitral (valve) stenosis: Secondary | ICD-10-CM | POA: Diagnosis not present

## 2024-11-16 DIAGNOSIS — I7121 Aneurysm of the ascending aorta, without rupture: Secondary | ICD-10-CM | POA: Diagnosis not present

## 2024-11-16 LAB — BASIC METABOLIC PANEL WITH GFR
BUN/Creatinine Ratio: 24 (ref 12–28)
BUN: 24 mg/dL (ref 8–27)
CO2: 21 mmol/L (ref 20–29)
Calcium: 9.4 mg/dL (ref 8.7–10.3)
Chloride: 99 mmol/L (ref 96–106)
Creatinine, Ser: 0.99 mg/dL (ref 0.57–1.00)
Glucose: 94 mg/dL (ref 70–99)
Potassium: 4.6 mmol/L (ref 3.5–5.2)
Sodium: 136 mmol/L (ref 134–144)
eGFR: 60 mL/min/1.73

## 2024-11-16 NOTE — Patient Instructions (Signed)
 Medication Instructions:  Your physician recommends that you continue on your current medications as directed. Please refer to the Current Medication list given to you today.  *If you need a refill on your cardiac medications before your next appointment, please call your pharmacy*  Lab Work: Today- BMET If you have labs (blood work) drawn today and your tests are completely normal, you will receive your results only by: MyChart Message (if you have MyChart) OR A paper copy in the mail If you have any lab test that is abnormal or we need to change your treatment, we will call you to review the results.  Testing/Procedures:   Your cardiac CT will be scheduled at one of the below locations:    [facility] [address]  [place], [place] [address]  If scheduled at the [facility] at [address], please enter the parking lot using the [address] entrance and use the FREE valet service at the patient drop-off area. Enter the building and check-in with registration on the main floor.  Please follow these instructions carefully (unless otherwise directed):  An IV will be required for this test and Nitroglycerin will be given.  Hold all erectile dysfunction medications at least 3 days (72 hrs) prior to test. (Ie viagra, cialis, sildenafil, tadalafil, etc)   On the Night Before the Test: Be sure to Drink plenty of water. Do not consume any caffeinated/decaffeinated beverages or chocolate 12 hours prior to your test. Do not take any antihistamines 12 hours prior to your test.  If the patient has contrast allergy: Patient will need a prescription for Prednisone and very clear instructions (as follows): Prednisone 50 mg - take 13 hours prior to test Take another Prednisone 50 mg 7 hours prior to test Take another Prednisone 50 mg 1 hour prior to test Take Benadryl 50 mg 1 hour prior to test Patient must complete all four doses of above  prophylactic medications. Patient will need a ride after test due to Benadryl.  On the Day of the Test: Drink plenty of water until 1 hour prior to the test. Do not eat any food 1 hour prior to test. You may take your regular medications prior to the test.  Take metoprolol (Lopressor) two hours prior to test. If you take Furosemide/Hydrochlorothiazide/Spironolactone/Chlorthalidone, please HOLD on the morning of the test. Patients who wear a continuous glucose monitor MUST remove the device prior to scanning. FEMALES- please wear underwire-free bra if available, avoid dresses & tight clothing       After the Test: Drink plenty of water. After receiving IV contrast, you may experience a mild flushed feeling. This is normal. On occasion, you may experience a mild rash up to 24 hours after the test. This is not dangerous. If this occurs, you can take Benadryl 25 mg, Zyrtec, Claritin, or Allegra and increase your fluid intake. (Patients taking Tikosyn should avoid Benadryl, and may take Zyrtec, Claritin, or Allegra) If you experience trouble breathing, this can be serious. If it is severe call 911 IMMEDIATELY. If it is mild, please call our office.  We will call to schedule your test 2-4 weeks out understanding that some insurance companies will need an authorization prior to the service being performed.   For more information and frequently asked questions, please visit our website : [email or web address]  For non-scheduling related questions, please contact the cardiac imaging nurse navigator should you have any questions/concerns: Cardiac Imaging Nurse Navigators Direct Office Dial: [phone]  For scheduling needs, including cancellations and rescheduling, please call Brittany, [phone].   Follow-Up: At [facility], you and your health needs are our priority.  As part of our continuing mission to provide you with exceptional heart care, our providers are all  part of one team.  This team includes your primary Cardiologist (physician) and Advanced Practice Providers or APPs (Physician Assistants and Nurse Practitioners) who all work together to provide you with the care you need, when you need it.  Your next appointment:   1 year(s)  Provider:   Lonni Cash, MD    We recommend signing up for the patient portal called MyChart.  Sign up information is provided on this After Visit Summary.  MyChart is used to connect with patients for Virtual Visits (Telemedicine).  Patients are able to view lab/test results, encounter notes, upcoming appointments, etc.  Non-urgent messages can be sent to your provider as well.   To learn more about what you can do with MyChart, go to [email or web address].

## 2024-11-17 ENCOUNTER — Ambulatory Visit: Payer: Self-pay | Admitting: Cardiovascular Disease

## 2024-11-18 ENCOUNTER — Ambulatory Visit (HOSPITAL_COMMUNITY)
Admission: RE | Admit: 2024-11-18 | Discharge: 2024-11-18 | Disposition: A | Discharge status: Home / Self Care | Source: Ambulatory Visit | Attending: Cardiovascular Disease | Admitting: Cardiovascular Disease

## 2024-11-18 DIAGNOSIS — I7121 Aneurysm of the ascending aorta, without rupture: Secondary | ICD-10-CM | POA: Diagnosis present

## 2024-11-18 DIAGNOSIS — I342 Nonrheumatic mitral (valve) stenosis: Secondary | ICD-10-CM | POA: Insufficient documentation

## 2024-11-18 MED ORDER — IOHEXOL 350 MG/ML SOLN
75.0000 mL | Freq: Once | INTRAVENOUS | Status: AC | PRN
  Administered 2024-11-18: 75 mL via INTRAVENOUS

## 2024-11-27 ENCOUNTER — Other Ambulatory Visit: Payer: Self-pay | Admitting: *Deleted

## 2024-11-27 DIAGNOSIS — I342 Nonrheumatic mitral (valve) stenosis: Secondary | ICD-10-CM

## 2024-11-27 DIAGNOSIS — I350 Nonrheumatic aortic (valve) stenosis: Secondary | ICD-10-CM
